# Patient Record
Sex: Male | Born: 1955 | Race: White | Hispanic: No | Marital: Married
Health system: Southern US, Community
[De-identification: ages and names within clinical notes are randomized; demographics above are authoritative.]

---

## 2004-07-20 ENCOUNTER — Encounter: Payer: Self-pay | Admitting: Psychiatry

## 2004-08-05 ENCOUNTER — Encounter: Payer: Self-pay | Admitting: Psychiatry

## 2004-09-05 ENCOUNTER — Encounter: Payer: Self-pay | Admitting: Psychiatry

## 2004-10-03 ENCOUNTER — Encounter: Payer: Self-pay | Admitting: Psychiatry

## 2004-11-03 ENCOUNTER — Encounter: Payer: Self-pay | Admitting: Psychiatry

## 2004-12-03 ENCOUNTER — Encounter: Payer: Self-pay | Admitting: Psychiatry

## 2005-01-03 ENCOUNTER — Encounter: Payer: Self-pay | Admitting: Psychiatry

## 2005-02-02 ENCOUNTER — Encounter: Payer: Self-pay | Admitting: Psychiatry

## 2005-07-16 ENCOUNTER — Encounter: Payer: Self-pay | Admitting: Family Medicine

## 2005-08-05 ENCOUNTER — Encounter: Payer: Self-pay | Admitting: Family Medicine

## 2007-02-16 ENCOUNTER — Ambulatory Visit: Payer: Self-pay | Admitting: Family Medicine

## 2007-04-13 ENCOUNTER — Other Ambulatory Visit: Payer: Self-pay

## 2007-04-13 ENCOUNTER — Inpatient Hospital Stay: Payer: Self-pay | Admitting: Otolaryngology

## 2010-10-22 ENCOUNTER — Ambulatory Visit: Payer: Self-pay | Admitting: Internal Medicine

## 2010-11-30 ENCOUNTER — Ambulatory Visit: Payer: Self-pay | Admitting: Internal Medicine

## 2010-12-10 ENCOUNTER — Ambulatory Visit: Payer: Self-pay | Admitting: Internal Medicine

## 2010-12-17 ENCOUNTER — Ambulatory Visit: Payer: Self-pay | Admitting: Family Medicine

## 2012-02-06 ENCOUNTER — Emergency Department: Payer: Self-pay | Admitting: *Deleted

## 2014-08-29 ENCOUNTER — Inpatient Hospital Stay (HOSPITAL_COMMUNITY): Payer: Medicare Other

## 2014-08-29 ENCOUNTER — Inpatient Hospital Stay: Payer: Self-pay | Admitting: Internal Medicine

## 2014-08-29 ENCOUNTER — Other Ambulatory Visit: Payer: Self-pay | Admitting: Physician Assistant

## 2014-08-29 DIAGNOSIS — I5021 Acute systolic (congestive) heart failure: Secondary | ICD-10-CM | POA: Diagnosis present

## 2014-08-29 DIAGNOSIS — Z515 Encounter for palliative care: Secondary | ICD-10-CM | POA: Diagnosis not present

## 2014-08-29 DIAGNOSIS — G931 Anoxic brain damage, not elsewhere classified: Secondary | ICD-10-CM | POA: Diagnosis not present

## 2014-08-29 DIAGNOSIS — Z8673 Personal history of transient ischemic attack (TIA), and cerebral infarction without residual deficits: Secondary | ICD-10-CM | POA: Diagnosis not present

## 2014-08-29 DIAGNOSIS — K567 Ileus, unspecified: Secondary | ICD-10-CM | POA: Diagnosis not present

## 2014-08-29 DIAGNOSIS — J95811 Postprocedural pneumothorax: Secondary | ICD-10-CM | POA: Diagnosis not present

## 2014-08-29 DIAGNOSIS — N183 Chronic kidney disease, stage 3 (moderate): Secondary | ICD-10-CM

## 2014-08-29 DIAGNOSIS — R570 Cardiogenic shock: Secondary | ICD-10-CM

## 2014-08-29 DIAGNOSIS — I129 Hypertensive chronic kidney disease with stage 1 through stage 4 chronic kidney disease, or unspecified chronic kidney disease: Secondary | ICD-10-CM | POA: Diagnosis not present

## 2014-08-29 DIAGNOSIS — I472 Ventricular tachycardia: Secondary | ICD-10-CM | POA: Diagnosis not present

## 2014-08-29 DIAGNOSIS — J9690 Respiratory failure, unspecified, unspecified whether with hypoxia or hypercapnia: Secondary | ICD-10-CM

## 2014-08-29 DIAGNOSIS — I4901 Ventricular fibrillation: Secondary | ICD-10-CM | POA: Diagnosis not present

## 2014-08-29 DIAGNOSIS — R739 Hyperglycemia, unspecified: Secondary | ICD-10-CM | POA: Diagnosis present

## 2014-08-29 DIAGNOSIS — I214 Non-ST elevation (NSTEMI) myocardial infarction: Secondary | ICD-10-CM | POA: Diagnosis present

## 2014-08-29 DIAGNOSIS — K7200 Acute and subacute hepatic failure without coma: Secondary | ICD-10-CM | POA: Diagnosis present

## 2014-08-29 DIAGNOSIS — I7092 Chronic total occlusion of artery of the extremities: Secondary | ICD-10-CM | POA: Diagnosis not present

## 2014-08-29 DIAGNOSIS — I2510 Atherosclerotic heart disease of native coronary artery without angina pectoris: Secondary | ICD-10-CM | POA: Diagnosis not present

## 2014-08-29 DIAGNOSIS — N179 Acute kidney failure, unspecified: Secondary | ICD-10-CM | POA: Diagnosis not present

## 2014-08-29 DIAGNOSIS — Z4659 Encounter for fitting and adjustment of other gastrointestinal appliance and device: Secondary | ICD-10-CM

## 2014-08-29 DIAGNOSIS — J449 Chronic obstructive pulmonary disease, unspecified: Secondary | ICD-10-CM | POA: Diagnosis not present

## 2014-08-29 DIAGNOSIS — J189 Pneumonia, unspecified organism: Secondary | ICD-10-CM | POA: Diagnosis not present

## 2014-08-29 DIAGNOSIS — I4510 Unspecified right bundle-branch block: Secondary | ICD-10-CM | POA: Diagnosis not present

## 2014-08-29 DIAGNOSIS — F17210 Nicotine dependence, cigarettes, uncomplicated: Secondary | ICD-10-CM | POA: Diagnosis present

## 2014-08-29 DIAGNOSIS — I5020 Unspecified systolic (congestive) heart failure: Secondary | ICD-10-CM | POA: Diagnosis not present

## 2014-08-29 DIAGNOSIS — J9601 Acute respiratory failure with hypoxia: Secondary | ICD-10-CM | POA: Diagnosis present

## 2014-08-29 DIAGNOSIS — E785 Hyperlipidemia, unspecified: Secondary | ICD-10-CM | POA: Diagnosis not present

## 2014-08-29 DIAGNOSIS — J984 Other disorders of lung: Secondary | ICD-10-CM

## 2014-08-29 DIAGNOSIS — Z66 Do not resuscitate: Secondary | ICD-10-CM | POA: Diagnosis not present

## 2014-08-29 DIAGNOSIS — N189 Chronic kidney disease, unspecified: Secondary | ICD-10-CM

## 2014-08-29 DIAGNOSIS — Z4682 Encounter for fitting and adjustment of non-vascular catheter: Secondary | ICD-10-CM

## 2014-08-29 DIAGNOSIS — I442 Atrioventricular block, complete: Secondary | ICD-10-CM | POA: Diagnosis present

## 2014-08-29 DIAGNOSIS — I70293 Other atherosclerosis of native arteries of extremities, bilateral legs: Secondary | ICD-10-CM | POA: Diagnosis present

## 2014-08-29 DIAGNOSIS — I469 Cardiac arrest, cause unspecified: Secondary | ICD-10-CM | POA: Diagnosis present

## 2014-08-29 DIAGNOSIS — E872 Acidosis: Secondary | ICD-10-CM | POA: Diagnosis not present

## 2014-08-29 DIAGNOSIS — J939 Pneumothorax, unspecified: Secondary | ICD-10-CM

## 2014-08-29 DIAGNOSIS — J9600 Acute respiratory failure, unspecified whether with hypoxia or hypercapnia: Secondary | ICD-10-CM | POA: Insufficient documentation

## 2014-08-29 DIAGNOSIS — E876 Hypokalemia: Secondary | ICD-10-CM | POA: Diagnosis not present

## 2014-08-29 DIAGNOSIS — I82443 Acute embolism and thrombosis of tibial vein, bilateral: Secondary | ICD-10-CM | POA: Diagnosis not present

## 2014-08-29 DIAGNOSIS — N170 Acute kidney failure with tubular necrosis: Secondary | ICD-10-CM | POA: Diagnosis not present

## 2014-08-29 DIAGNOSIS — J9801 Acute bronchospasm: Secondary | ICD-10-CM

## 2014-08-29 LAB — BLOOD GAS, ARTERIAL
Acid-base deficit: 11.2 mmol/L — ABNORMAL HIGH (ref 0.0–2.0)
Bicarbonate: 15.3 mEq/L — ABNORMAL LOW (ref 20.0–24.0)
Drawn by: 39898
FIO2: 0.4 %
MECHVT: 600 mL
O2 SAT: 95.3 %
PATIENT TEMPERATURE: 98.6
PCO2 ART: 40.7 mmHg (ref 35.0–45.0)
PEEP: 5 cmH2O
PH ART: 7.2 — AB (ref 7.350–7.450)
PO2 ART: 104 mmHg — AB (ref 80.0–100.0)
RATE: 16 resp/min
TCO2: 16.5 mmol/L (ref 0–100)

## 2014-08-29 LAB — URINALYSIS, COMPLETE
Bacteria: NONE SEEN
Bilirubin,UR: NEGATIVE
Bilirubin,UR: NEGATIVE
Glucose,UR: NEGATIVE mg/dL (ref 0–75)
Granular Cast: 28
Ketone: NEGATIVE
Leukocyte Esterase: NEGATIVE
NITRITE: NEGATIVE
NITRITE: NEGATIVE
PH: 5 (ref 4.5–8.0)
PH: 5 (ref 4.5–8.0)
Protein: 100
RBC,UR: 199 /HPF (ref 0–5)
RBC,UR: 28 /HPF (ref 0–5)
SPECIFIC GRAVITY: 1.015 (ref 1.003–1.030)
SPECIFIC GRAVITY: 1.033 (ref 1.003–1.030)
Squamous Epithelial: 3
Squamous Epithelial: 2
WBC UR: 63 /HPF (ref 0–5)
WBC UR: 32 /HPF (ref 0–5)

## 2014-08-29 LAB — MRSA PCR SCREENING: MRSA by PCR: NEGATIVE

## 2014-08-29 LAB — COMPREHENSIVE METABOLIC PANEL
ALT: 211 U/L — AB
Albumin: 2.5 g/dL — ABNORMAL LOW (ref 3.4–5.0)
Albumin: 2.6 g/dL — ABNORMAL LOW (ref 3.4–5.0)
Alkaline Phosphatase: 83 U/L
Alkaline Phosphatase: 142 U/L — ABNORMAL HIGH
Anion Gap: 24 — ABNORMAL HIGH (ref 7–16)
Anion Gap: 20 — ABNORMAL HIGH (ref 7–16)
BUN: 13 mg/dL (ref 7–18)
BUN: 16 mg/dL (ref 7–18)
Bilirubin,Total: <0.1 mg/dL — ABNORMAL LOW (ref 0.2–1.0)
Bilirubin,Total: 0.3 mg/dL (ref 0.2–1.0)
CHLORIDE: 109 mmol/L — AB (ref 98–107)
CHLORIDE: 107 mmol/L (ref 98–107)
CO2: 17 mmol/L — AB (ref 21–32)
CO2: 18 mmol/L — AB (ref 21–32)
CREATININE: 1.9 mg/dL — AB (ref 0.60–1.30)
Calcium, Total: 11.5 mg/dL — ABNORMAL HIGH (ref 8.5–10.1)
Calcium, Total: 8.7 mg/dL (ref 8.5–10.1)
Creatinine: 1.74 mg/dL — ABNORMAL HIGH (ref 0.60–1.30)
EGFR (Non-African Amer.): 43 — ABNORMAL LOW
EGFR (Non-African Amer.): 39 — ABNORMAL LOW
GFR CALC AF AMER: 52 — AB
GFR CALC AF AMER: 47 — AB
Glucose: 220 mg/dL — ABNORMAL HIGH (ref 65–99)
Glucose: 240 mg/dL — ABNORMAL HIGH (ref 65–99)
OSMOLALITY: 305 (ref 275–301)
Osmolality: 298 (ref 275–301)
POTASSIUM: 4.3 mmol/L (ref 3.5–5.1)
Potassium: 3 mmol/L — ABNORMAL LOW (ref 3.5–5.1)
SGOT(AST): 67 U/L — ABNORMAL HIGH (ref 15–37)
SGOT(AST): 732 U/L — ABNORMAL HIGH (ref 15–37)
SGPT (ALT): 56 U/L
SODIUM: 150 mmol/L — AB (ref 136–145)
Sodium: 145 mmol/L (ref 136–145)
Total Protein: 5.7 g/dL — ABNORMAL LOW (ref 6.4–8.2)
Total Protein: 6.4 g/dL (ref 6.4–8.2)

## 2014-08-29 LAB — BASIC METABOLIC PANEL
ANION GAP: 17 — AB (ref 5–15)
Anion gap: 17 — ABNORMAL HIGH (ref 5–15)
BUN: 16 mg/dL (ref 6–23)
BUN: 17 mg/dL (ref 6–23)
CHLORIDE: 103 mmol/L (ref 96–112)
CO2: 22 mmol/L (ref 19–32)
CO2: 23 mmol/L (ref 19–32)
Calcium: 8.4 mg/dL (ref 8.4–10.5)
Calcium: 8.4 mg/dL (ref 8.4–10.5)
Chloride: 105 mmol/L (ref 96–112)
Creatinine, Ser: 2.08 mg/dL — ABNORMAL HIGH (ref 0.50–1.35)
Creatinine, Ser: 2.2 mg/dL — ABNORMAL HIGH (ref 0.50–1.35)
GFR calc Af Amer: 38 mL/min — ABNORMAL LOW (ref >90)
GFR calc Af Amer: 36 mL/min — ABNORMAL LOW (ref >90)
GFR calc non Af Amer: 31 mL/min — ABNORMAL LOW (ref >90)
GFR, EST NON AFRICAN AMERICAN: 33 mL/min — AB (ref >90)
Glucose, Bld: 262 mg/dL — ABNORMAL HIGH (ref 70–99)
Glucose, Bld: 267 mg/dL — ABNORMAL HIGH (ref 70–99)
Potassium: 3.3 mmol/L — ABNORMAL LOW (ref 3.5–5.1)
Potassium: 3.7 mmol/L (ref 3.5–5.1)
Sodium: 144 mmol/L (ref 135–145)
Sodium: 143 mmol/L (ref 135–145)

## 2014-08-29 LAB — CBC WITH DIFFERENTIAL/PLATELET
BASOS ABS: 0.1 10*3/uL (ref 0.0–0.1)
BASOS PCT: 0.4 %
Bands: 2 %
Comment - H1-Com1: NORMAL
EOS ABS: 0.1 10*3/uL (ref 0.0–0.7)
Eosinophil %: 0.2 %
HCT: 45.9 % (ref 40.0–52.0)
HCT: 45.2 % (ref 40.0–52.0)
HGB: 14 g/dL (ref 13.0–18.0)
HGB: 14.5 g/dL (ref 13.0–18.0)
LYMPHS PCT: 52 %
Lymphocyte #: 2 10*3/uL (ref 1.0–3.6)
Lymphocyte %: 7.3 %
MCH: 30.5 pg (ref 26.0–34.0)
MCH: 30 pg (ref 26.0–34.0)
MCHC: 30.4 g/dL — ABNORMAL LOW (ref 32.0–36.0)
MCHC: 32.1 g/dL (ref 32.0–36.0)
MCV: 100 fL (ref 80–100)
MCV: 93 fL (ref 80–100)
MONO ABS: 0.8 x10 3/mm (ref 0.2–1.0)
Metamyelocyte: 4 %
Monocyte %: 3.1 %
Monocytes: 13 %
Myelocyte: 1 %
Neutrophil #: 24 10*3/uL — ABNORMAL HIGH (ref 1.4–6.5)
Neutrophil %: 89 %
PLATELETS: 155 10*3/uL (ref 150–440)
PLATELETS: 174 10*3/uL (ref 150–440)
RBC: 4.58 10*6/uL (ref 4.40–5.90)
RBC: 4.83 10*6/uL (ref 4.40–5.90)
RDW: 14.4 % (ref 11.5–14.5)
RDW: 14 % (ref 11.5–14.5)
SEGMENTED NEUTROPHILS: 28 %
WBC: 13.4 10*3/uL — AB (ref 3.8–10.6)
WBC: 27 10*3/uL — AB (ref 3.8–10.6)

## 2014-08-29 LAB — PROTIME-INR
INR: 1.4
INR: 1.8
INR: 1.86 — ABNORMAL HIGH (ref 0.00–1.49)
PROTHROMBIN TIME: 20.2 s — AB (ref 11.5–14.7)
Prothrombin Time: 17.2 secs — ABNORMAL HIGH (ref 11.5–14.7)
Prothrombin Time: 21.6 seconds — ABNORMAL HIGH (ref 11.6–15.2)

## 2014-08-29 LAB — TROPONIN I
Troponin I: 37.78 ng/mL (ref <0.031)
Troponin-I: 2.2 ng/mL — ABNORMAL HIGH
Troponin-I: >40 ng/mL

## 2014-08-29 LAB — HEPATIC FUNCTION PANEL A (ARMC)
Albumin: 2.7 g/dL — ABNORMAL LOW (ref 3.4–5.0)
Alkaline Phosphatase: 122 U/L — ABNORMAL HIGH
BILIRUBIN TOTAL: 0.3 mg/dL (ref 0.2–1.0)
SGOT(AST): 478 U/L — ABNORMAL HIGH (ref 15–37)
SGPT (ALT): 210 U/L — ABNORMAL HIGH
Total Protein: 6.2 g/dL — ABNORMAL LOW (ref 6.4–8.2)

## 2014-08-29 LAB — DIC (DISSEMINATED INTRAVASCULAR COAGULATION) PANEL
Fibrinogen: 162 mg/dL — ABNORMAL LOW (ref 204–475)
PROTHROMBIN TIME: 21.8 s — AB (ref 11.6–15.2)

## 2014-08-29 LAB — CK: CK, Total: 6031 U/L — ABNORMAL HIGH (ref 39–308)

## 2014-08-29 LAB — DIC (DISSEMINATED INTRAVASCULAR COAGULATION)PANEL
INR: 1.88 — ABNORMAL HIGH (ref 0.00–1.49)
Platelets: 140 10*3/uL — ABNORMAL LOW (ref 150–400)
Smear Review: NONE SEEN
aPTT: 46 seconds — ABNORMAL HIGH (ref 24–37)

## 2014-08-29 LAB — PHOSPHORUS
PHOSPHORUS: 8.6 mg/dL — AB (ref 2.5–4.9)
Phosphorus: 7.4 mg/dL — ABNORMAL HIGH (ref 2.5–4.9)

## 2014-08-29 LAB — CK-MB: CK-MB: 287.5 ng/mL — ABNORMAL HIGH (ref 0.5–3.6)

## 2014-08-29 LAB — APTT
APTT: 36 s (ref 24–37)
Activated PTT: 39.4 secs — ABNORMAL HIGH (ref 23.6–35.9)

## 2014-08-29 LAB — PRO B NATRIURETIC PEPTIDE: B-TYPE NATIURETIC PEPTID: 6791 pg/mL — AB (ref 0–125)

## 2014-08-29 LAB — MAGNESIUM
MAGNESIUM: 2.7 mg/dL — AB
Magnesium: 2.4 mg/dL

## 2014-08-29 LAB — CK TOTAL AND CKMB (NOT AT ARMC)
CK, Total: 404 U/L — ABNORMAL HIGH (ref 39–308)
CK-MB: 16.1 ng/mL — AB (ref 0.5–3.6)

## 2014-08-29 MED ORDER — ASPIRIN 300 MG RE SUPP
300.0000 mg | Freq: Every day | RECTAL | Status: DC
  Administered 2014-08-30: 300 mg via RECTAL
  Filled 2014-08-29 (×2): qty 1

## 2014-08-29 MED ORDER — CISATRACURIUM BOLUS VIA INFUSION: 0.0500 mg/kg | INTRAVENOUS | Status: DC | PRN

## 2014-08-29 MED ORDER — MIDAZOLAM HCL 2 MG/2ML IJ SOLN
2.0000 mg | Freq: Once | INTRAMUSCULAR | Status: DC
Start: 2014-08-29 — End: 2014-08-30

## 2014-08-29 MED ORDER — SODIUM BICARBONATE 8.4 % IV SOLN
INTRAVENOUS | Status: DC
  Administered 2014-08-29 – 2014-08-30 (×2): via INTRAVENOUS
  Filled 2014-08-29 (×3): qty 850

## 2014-08-29 MED ORDER — POTASSIUM CHLORIDE 10 MEQ/50ML IV SOLN
10.0000 meq | INTRAVENOUS | Status: AC
Start: 2014-08-29 — End: 2014-08-30
  Administered 2014-08-30 (×4): 10 meq via INTRAVENOUS

## 2014-08-29 MED ORDER — FENTANYL BOLUS VIA INFUSION
50.0000 ug | INTRAVENOUS | Status: DC | PRN
  Administered 2014-08-31: 50 ug via INTRAVENOUS
  Filled 2014-08-29: qty 50

## 2014-08-29 MED ORDER — CISATRACURIUM BOLUS VIA INFUSION
0.1000 mg/kg | Freq: Once | INTRAVENOUS | Status: AC
  Administered 2014-08-29: 9.1 mg via INTRAVENOUS
  Filled 2014-08-29: qty 10

## 2014-08-29 MED ORDER — NOREPINEPHRINE BITARTRATE 1 MG/ML IV SOLN
0.0000 ug/min | INTRAVENOUS | Status: DC
  Administered 2014-08-29: 15 ug/min via INTRAVENOUS
  Administered 2014-08-29: 25 ug/min via INTRAVENOUS
  Administered 2014-08-30: 45 ug/min via INTRAVENOUS
  Filled 2014-08-29 (×3): qty 16

## 2014-08-29 MED ORDER — SODIUM CHLORIDE 0.9 % IV SOLN
0.5000 ug/kg/min | INTRAVENOUS | Status: AC
  Administered 2014-08-29: 0.5 ug/kg/min via INTRAVENOUS
  Filled 2014-08-29: qty 20

## 2014-08-29 MED ORDER — SODIUM CHLORIDE 0.9 % IV SOLN: 2000.0000 mL | Freq: Once | INTRAVENOUS | Status: DC

## 2014-08-29 MED ORDER — SODIUM CHLORIDE 0.9 % IV SOLN
1.0000 ug/kg/min | INTRAVENOUS | Status: DC
  Administered 2014-08-29: 1 ug/kg/min via INTRAVENOUS

## 2014-08-29 MED ORDER — SODIUM CHLORIDE 0.9 % IV SOLN
1.0000 mg/h | INTRAVENOUS | Status: DC
  Filled 2014-08-29: qty 10

## 2014-08-29 MED ORDER — SODIUM CHLORIDE 0.9 % IV SOLN
INTRAVENOUS | Status: DC
  Administered 2014-08-29: 19:00:00 via INTRAVENOUS

## 2014-08-29 MED ORDER — FENTANYL CITRATE 0.05 MG/ML IJ SOLN
100.0000 ug | Freq: Once | INTRAMUSCULAR | Status: AC
  Administered 2014-08-29: 100 ug via INTRAVENOUS

## 2014-08-29 MED ORDER — MIDAZOLAM HCL 5 MG/ML IJ SOLN
1.0000 mg/h | INTRAMUSCULAR | Status: DC
  Administered 2014-08-29: 1 mg/h via INTRAVENOUS
  Administered 2014-08-30: 2 mg/h via INTRAVENOUS
  Filled 2014-08-29 (×3): qty 10

## 2014-08-29 MED ORDER — MIDAZOLAM BOLUS VIA INFUSION
2.0000 mg | INTRAVENOUS | Status: DC | PRN
  Filled 2014-08-29: qty 2

## 2014-08-29 MED ORDER — BUDESONIDE 0.25 MG/2ML IN SUSP
0.2500 mg | Freq: Four times a day (QID) | RESPIRATORY_TRACT | Status: DC
  Filled 2014-08-29 (×3): qty 2

## 2014-08-29 MED ORDER — DOPAMINE-DEXTROSE 3.2-5 MG/ML-% IV SOLN
5.0000 ug/kg/min | INTRAVENOUS | Status: DC
  Administered 2014-08-29: 5 ug/kg/min via INTRAVENOUS

## 2014-08-29 MED ORDER — IPRATROPIUM-ALBUTEROL 0.5-2.5 (3) MG/3ML IN SOLN
3.0000 mL | Freq: Four times a day (QID) | RESPIRATORY_TRACT | Status: DC
  Administered 2014-08-29 – 2014-08-30 (×5): 3 mL via RESPIRATORY_TRACT
  Filled 2014-08-29 (×5): qty 3

## 2014-08-29 MED ORDER — DEXTROSE 5 % IV SOLN
0.5000 ug/min | INTRAVENOUS | Status: DC
  Filled 2014-08-29: qty 4

## 2014-08-29 MED ORDER — CISATRACURIUM BOLUS VIA INFUSION
0.0500 mg/kg | INTRAVENOUS | Status: DC | PRN
Start: 2014-08-29 — End: 2014-08-31
  Filled 2014-08-29: qty 5

## 2014-08-29 MED ORDER — NOREPINEPHRINE BITARTRATE 1 MG/ML IV SOLN
0.0000 ug/min | INTRAVENOUS | Status: DC
  Filled 2014-08-29: qty 16

## 2014-08-29 MED ORDER — INSULIN ASPART 100 UNIT/ML ~~LOC~~ SOLN
0.0000 [IU] | SUBCUTANEOUS | Status: DC
  Administered 2014-08-29: 3 [IU] via SUBCUTANEOUS
  Administered 2014-08-30: 2 [IU] via SUBCUTANEOUS

## 2014-08-29 MED ORDER — HEPARIN (PORCINE) IN NACL 100-0.45 UNIT/ML-% IJ SOLN
1200.0000 [IU]/h | INTRAMUSCULAR | Status: DC
  Administered 2014-08-29 – 2014-08-30 (×2): 1200 [IU]/h via INTRAVENOUS
  Filled 2014-08-29 (×3): qty 250

## 2014-08-29 MED ORDER — AMIODARONE HCL IN DEXTROSE 360-4.14 MG/200ML-% IV SOLN
30.0000 mg/h | INTRAVENOUS | Status: DC
  Administered 2014-08-30 (×2): 30 mg/h via INTRAVENOUS
  Filled 2014-08-29 (×5): qty 200

## 2014-08-29 MED ORDER — AMIODARONE HCL IN DEXTROSE 360-4.14 MG/200ML-% IV SOLN
60.0000 mg/h | INTRAVENOUS | Status: AC
  Administered 2014-08-29 (×2): 60 mg/h via INTRAVENOUS
  Filled 2014-08-29 (×2): qty 200

## 2014-08-29 MED ORDER — CISATRACURIUM BOLUS VIA INFUSION: 0.1000 mg/kg | Freq: Once | INTRAVENOUS | Status: DC

## 2014-08-29 MED ORDER — SODIUM CHLORIDE 0.9 % IV SOLN
25.0000 ug/h | INTRAVENOUS | Status: DC
  Administered 2014-08-30: 100 ug/h via INTRAVENOUS
  Filled 2014-08-29 (×2): qty 50

## 2014-08-29 MED ORDER — POTASSIUM CHLORIDE 10 MEQ/50ML IV SOLN
INTRAVENOUS | Status: AC
  Filled 2014-08-29: qty 200

## 2014-08-29 MED ORDER — VASOPRESSIN 20 UNIT/ML IV SOLN
0.0300 [IU]/min | INTRAVENOUS | Status: DC
  Administered 2014-08-29 – 2014-08-30 (×2): 0.03 [IU]/min via INTRAVENOUS
  Filled 2014-08-29 (×3): qty 2

## 2014-08-29 MED ORDER — ARTIFICIAL TEARS OP OINT
1.0000 "application " | TOPICAL_OINTMENT | Freq: Three times a day (TID) | OPHTHALMIC | Status: DC
  Administered 2014-08-30 (×4): 1 via OPHTHALMIC
  Filled 2014-08-29: qty 3.5

## 2014-08-29 MED ORDER — CISATRACURIUM BESYLATE 10 MG/ML IV SOLN
1.0000 ug/kg/min | INTRAVENOUS | Status: DC
  Administered 2014-08-29 – 2014-08-30 (×2): 1 ug/kg/min via INTRAVENOUS
  Filled 2014-08-29 (×2): qty 20

## 2014-08-29 MED ORDER — DOPAMINE-DEXTROSE 3.2-5 MG/ML-% IV SOLN
INTRAVENOUS | Status: AC
  Filled 2014-08-29: qty 250

## 2014-08-29 MED ORDER — BUDESONIDE 0.25 MG/2ML IN SUSP
0.2500 mg | Freq: Four times a day (QID) | RESPIRATORY_TRACT | Status: DC
  Administered 2014-08-29 – 2014-08-30 (×5): 0.25 mg via RESPIRATORY_TRACT
  Filled 2014-08-29 (×11): qty 2

## 2014-08-29 MED ORDER — FAMOTIDINE IN NACL 20-0.9 MG/50ML-% IV SOLN
20.0000 mg | Freq: Two times a day (BID) | INTRAVENOUS | Status: DC
  Administered 2014-08-29 – 2014-08-30 (×3): 20 mg via INTRAVENOUS
  Filled 2014-08-29 (×4): qty 50

## 2014-08-29 NOTE — H&P (Deleted)
PULMONARY / CRITICAL CARE MEDICINE   Name: Dustin Chavez MRN: [id number] DOB: February 03, 1956    ADMISSION DATE:  Oct 09, 2014 CONSULTATION DATE:  Oct 09, 2014  [facility]  [facility]  CHIEF COMPLAINT:  Cardiac arrest  INITIAL PRESENTATION: 59 year old male presented to [facility] c/o SOB. VT/VF arrest in ED. Cardiac cath found 3 vessel disease and performed no intervention. VT/VF arrest again in cath lab. Exact total ACLS time unknown but estimated at less than 25 mins. Hypothermia protocol initiated and [facility].   STUDIES:   SIGNIFICANT EVENTS: 1/25 VT/VF arrest x2, intubated. Admitted to ICU. 3 vessel disease by cath, LVEF 40% per LVgram.   HISTORY OF PRESENT ILLNESS:  59 year old male, 3 ppd smoker with PMH of HTN and HLD began feeling SOB 1/24 PM. 1/25 AM he contacted EMS and [facility]. He was intubated for respiratory distress and then suffered a VT/VF arrest. After ROSC was achieved he was taken to cath lab, which discovered 3 vessel disease, including complete occlusion of LCX and RCA. Recommended surgical intervention. He suffered another arrest in cath lab, which lasted about 10 minutes. Also CXR showed L PTX, presumed to be the result of CPR. Therapeutic hypothermia protocol initiated and [facility] PCCM to admit.   PAST MEDICAL HISTORY :   has no past medical history on file.  has no past surgical history on file. Prior to Admission medications   Not on File   Allergies not on file  FAMILY HISTORY:  has no family status information on file.  SOCIAL HISTORY:    REVIEW OF SYSTEMS:  Unable, intubated  SUBJECTIVE:   VITAL SIGNS: FiO2 (%):  [40 %] 40 % (01/25 1814) Weight:  [91.3 kg (201 lb 4.5 oz)] 91.3 kg (201 lb 4.5 oz) (01/25 1814) HEMODYNAMICS:   VENTILATOR SETTINGS: Vent Mode:  [-] PRVC FiO2 (%):  [40 %] 40 % Set Rate:  [16 bmp] 16 bmp Vt Set:  [600 mL] 600 mL PEEP:  [5 cmH20] 5 cmH20 Plateau Pressure:  [19  cmH20] 19 cmH20 INTAKE / OUTPUT: No intake or output data in the 24 hours ending 04-15-2015 1848  PHYSICAL EXAMINATION: General:  Overweight male sedated on vent Neuro:  sedated HEENT:  [place]/AT, no JVD noted Cardiovascular:  Paced rhythm at 100bpm. No MRG noted Lungs:  Clear bilateral breath sounds, L chest tube in place, with mild bloody drainage. Abdomen:  Obese, soft, non-distended Musculoskeletal:  No acute deformity Skin:  Grossly intact  LABS:  CBC No results for input(s): WBC, HGB, HCT, PLT in the last 168 hours. Coag's No results for input(s): APTT, INR in the last 168 hours. BMET No results for input(s): NA, K, CL, CO2, BUN, CREATININE, GLUCOSE in the last 168 hours. Electrolytes No results for input(s): CALCIUM, MG, PHOS in the last 168 hours. Sepsis Markers No results for input(s): LATICACIDVEN, PROCALCITON, O2SATVEN in the last 168 hours. ABG No results for input(s): PHART, PCO2ART, PO2ART in the last 168 hours. Liver Enzymes No results for input(s): AST, ALT, ALKPHOS, BILITOT, ALBUMIN in the last 168 hours. Cardiac Enzymes No results for input(s): TROPONINI, PROBNP in the last 168 hours. Glucose No results for input(s): GLUCAP in the last 168 hours.  Imaging No results found.   ASSESSMENT / PLAN:  PULMONARY OETT 1/25 >> A: Acute respiratory failure 2nd to cardiac arrest L PTX, suspected 2nd to CPR  P:   Full vent support Follow ABG  CXR in AM VAP prevention/mouth care per protocol Left CT to 20 cm/H2O suction  CARDIOVASCULAR CVL RIJ 1/25 >>> L fem art line 1/25 >>> A:  VT/VF cardiac arrest Cardiogenic shock CAD Complete heart block  P:  Cardiology aware Amiodarone gtt MAP goal > 80 mm/Hg Levophed, titrate to achieve MAP goal Vasopressin Transvenous pacing per cards Gentle IVF resuscitation Heparin gtt per pharmacy Therapeutic hypothermia 33 degrees Cycle troponin  RENAL A:   AKI  P:   Follow Bmet  GASTROINTESTINAL A:   No  acute issues  P:   OGT in place NPO for now SUP: IV protonix  HEMATOLOGIC A:   No acute issues  P:  Follow CBC On heparin gtt  INFECTIOUS A:   No acute issues  P:   Monitor clinically  ENDOCRINE A:   No acute issues    P:   CBG monitoring with SSI  NEUROLOGIC A:   Acute metabolic encephalopathy   P:   RASS goal: -5 Midazolam, fentanyl gtt Nimbex   FAMILY  - Updates: No family at bedside  - Inter-disciplinary family meet or Palliative Care meeting due by:  2/1   Joneen Roach, AGACNP-BC [facility] Pager [phone] or [phone]   ,

## 2014-08-29 NOTE — Progress Notes (Signed)
  Patient Name: Warden Fillersdward M Lythgoe   MRN: [id number]   Date of Birth/ Sex: 10-15-1955 , male      Admission Date: 05-Aug-2015  Attending Provider: Nelda Bucksaniel J Feinstein, MD  Primary Diagnosis: <principal problem not specified>   Indication:  Patient with severe 3 vessel disease, s/p VT/VF cardiac arrest 09-07-2014 [facility]. He had 15 mins CPR at cath lab as well before LHC. Before our arrival, he was having issues with pacer. Dr. Tresa EndoKelly cardiology was present in the room and and Dr. Delton CoombesByrum PCCM E-link was with us during the code. Patient did not lose pulse, had pulse confirmed manually and also via doppler on left femoral artery. Arterial line was placed for better measurement of blood pressure. He was started on levo, dopa, and vaso for pressure support. He had 3 episodes of VF requiring defib. Was put on amio drip as well. Pt was in his usual state of health until 08/28/2014. At the time of arrival on scene, ACLS protocol was underway.   Technical Description:  - CPR performance duration:  No cpr.   - Was defibrillation or cardioversion used? Yes   - Was external pacer placed? Yes  - Was patient intubated pre/post CPR? Yes   Medications Administered: Y = Yes; Blank = No Amiodarone  y  Atropine    Calcium  y  Epinephrine  y  Lidocaine    Magnesium  y  Norepinephrine  y  Phenylephrine    Sodium bicarbonate  y  Vasopressin  y   Post CPR evaluation:  - Final Status - Was patient successfully resuscitated ? Yes - What is current rhythm? NSR - What is current hemodynamic status? On pressors (levo, dopa, and vaso).  Miscellaneous Information:  - Labs sent, including: CBC, CMP, lactate, CXR.   - Primary team notified?  Yes  - Family Notified? Yes  - Additional notes/ transfer status:   Pacer back up rhythm was set to 60. Pressors were titrated and left on vaso, dopa, levo. Also left on amio drip. RN was told by Dr. Tresa EndoKelly to give lidocaine if he gets Vtach  again. PCCM and cardiology will continue to follow. CT surgery is aware of the patient and his guarded prognosis. Continue heparin for anticoag. Cont hypothermia protocol.     Hyacinth Meekerasrif Marlis Oldaker, MD  05-Aug-2015, 11:19 PM

## 2014-08-29 NOTE — Progress Notes (Signed)
ANTICOAGULATION CONSULT NOTE - Initial Consult  Pharmacy Consult for Heparin Indication: chest pain/ACS  Allergies not on file  Patient Measurements: Height: 5\' 9"  (175.3 cm) Weight: 201 lb 4.5 oz (91.3 kg) IBW/kg (Calculated) : 70.7 Heparin Dosing Weight: 88 kg  Vital Signs:    Labs: No results for input(s): HGB, HCT, PLT, APTT, LABPROT, INR, HEPARINUNFRC, CREATININE, CKTOTAL, CKMB, TROPONINI in the last 72 hours.  CrCl cannot be calculated (Unknown ideal weight.).   Medical History: No past medical history on file.  Medications:  No prescriptions prior to admission   Scheduled:  . sodium chloride  2,000 mL Intravenous Once  . artificial tears  1 application Both Eyes 3 times per day  . [START ON 08/30/2014] aspirin  300 mg Rectal Daily  . cisatracurium (NIMBEX) infusion  0.5-10 mcg/kg/min (Order-Specific) Intravenous STAT  . cisatracurium  0.1 mg/kg Intravenous Once  . famotidine (PEPCID) IV  20 mg Intravenous Q12H  . insulin aspart  0-9 Units Subcutaneous 6 times per day  . ipratropium-albuterol  3 mL Nebulization Q6H  . midazolam  2 mg Intravenous Once   Infusions:  . amiodarone 60 mg/hr (2014/08/20 1830)   Followed by  . [START ON 08/30/2014] amiodarone    . cisatracurium (NIMBEX) infusion    . fentaNYL infusion INTRAVENOUS 100 mcg/hr (2014/08/20 1830)  . midazolam (VERSED) infusion    . norepinephrine (LEVOPHED) Adult infusion 25 mcg/min (2014/08/20 1844)    Assessment: 58yo male with h/o HTN and HLD presented [facility] with SOB on 1/24. Pt suffered VT/VF arrest in ED and went to cath finding 3 vessel disease. He was initiated on hypothermia protocol and [facility]. Pharmacy is consulted to dose heparin for ACS/STEMI. Hgb is pending, Plt 140, sCr 2.2.  Goal of Therapy:  Heparin level 0.3-0.7 units/ml Monitor platelets by anticoagulation protocol: Yes   Plan:  Start heparin infusion at 1200 units/hr Check anti-Xa level in 6 hours and daily while on  heparin Continue to monitor H&H and platelets  Monitor s/sx of bleeding  Arlean Hoppingorey M. Newman PiesBall, PharmD Clinical Pharmacist Pager [phone] 08/07/2014,6:34 PM

## 2014-08-29 NOTE — Progress Notes (Signed)
Pt transvenous pacer failed to capture, pt HR in the 50s. RT in room attempting A-line placement noted sudden loss of dopplered pulse in rt radial artery. Faint pulse was noted by this RN in left corotid artery. Cardiology fellow and CCM at bedside. A-line placed by CCM. Pt shocked out of VT twice. ACLS meds given. See code sheet for details. Last vitals: HR:77, BP: 156/77, SpO2: 97.

## 2014-08-29 NOTE — Consult Note (Signed)
Admit date: 09/02/2014 Referring Physician  Dr. Sung Amabile Primary Physician No PCP Per Patient Primary Cardiologist  Dr. Welton Flakes [facility]) Reason for Consultation  Cardiac arrest, 3v VAD  HPI: 59 year old male who survived cardiac arrest on 08/21/2014 prior to cardiac catheterization at [facility] which demonstrated severe three-vessel coronary artery disease transported to [facility] for hypothermia protocol and possible coronary artery bypass grafting.  Risk factors include 3 pack per day smoker, hypertension, hyperlipidemia, ? stroke.  He began feeling shortness of breath day prior to cardiac arrest on 08/28/14 then early in the morning of 08/30/2014 he contacted EMS and was transported to [facility] suffering acute respiratory failure. He then had witnessed VT/VF cardiac arrest. After approximate 15 minutes, return of spontaneous circulation was achieved and he was then taken to the cardiac catheterization lab where diagnostic coronary angiography was performed demonstrating occlusion of left circumflex as well as right coronary artery. 80% mid LAD lesion. During procedure, he coded once again, approximate 10 minutes of resuscitation efforts ensued. Temporary pacer wire was placed.  After intubation, chest x-ray demonstrated left pneumothorax. Chest tube was inserted. Hypothermia protocol began.  Apparently, ejection fraction was 40% from LV gram.   When discussing with on call MD from earlier in the day, Dr. Laneta Simmers of cardiothoracic surgery is apparently aware of the case.     PMH:  As above  PSH:  No past surgical history on file. Allergies:  Review of patient's allergies indicates no known allergies. Prior to Admit Meds:   Prior to Admission medications   Not on File   Fam HX:   Currently noncontributory, possibly no early family history of coronary artery disease.  Social HX:    History   Social History  . Marital Status: Married    Spouse Name:  N/A    Number of Children: N/A  . Years of Education: N/A   Occupational History  . Not on file.   Social History Main Topics  . Smoking status: Not on file  . Smokeless tobacco: Not on file  . Alcohol Use: Not on file  . Drug Use: Not on file  . Sexual Activity: Not on file   Other Topics Concern  . Not on file   Social History Narrative  . No narrative on file     ROS: Unable to conduct review of systems secondary to patient's current state.   Physical Exam: Blood pressure 122/98, pulse 99, resp. rate 19, height  (1.753 m), weight 201 lb 4.5 oz (91.3 kg), SpO2 96 %.   General: Sedated, on ventilator, hypothermia protocol Head: Pupils seem to react very sluggishly, possible petechiae on forehead   Normal cephalic and atramatic  Lungs:   Vent noise. Expiratory wheeze Heart:   HRRR S1 S2 Pulses are 2+ & equal. No murmur, rubs, gallops.  No carotid bruit. No JVD.  No abdominal bruits.  Abdomen: Decreased bowel sounds, abdomen soft and non-tender without masses. No hepatosplenomegaly. Msk: Unable to assess Extremities:  No clubbing, cyanosis or edema.  DP +1 Neuro: Obtunded GU: Deferred Rectal: Deferred Psych:  Sedate      Labs:  Serum creatinine 1.74 at [facility].   Radiology:  Left pneumothorax seen on chest film at [facility]. Personally viewed.  EKG:  Currently sinus rhythm, no ST segment changes on telemetry. Currently not paced. Personally viewed.   ASSESSMENT/PLAN:    59 year old male with cardiac arrest, VT/VF on 08/07/2014 with ejection fraction of 40%, severe  triple-vessel coronary artery disease with occluded left circumflex, right coronary artery as well as 80% mid LAD lesion, intubated with acute respiratory failure, left pneumothorax status post chest tube, temporary pacer wire post cardiac arrest currently on hypothermia protocol.  Cardiac arrest -Hypothermia protocol per critical care medicine -With his severe coronary artery  disease, we will have to evaluate for candidacy for bypass surgery. Contact cardiothoracic surgery once neurologic status post arrest understood. Dr. Laneta SimmersBartle may be aware of the case. Make sure that disc of cardiac catheterization images is easily available.  Coronary artery disease -Obtain echocardiogram -40% ejection fraction on catheterization -When able, statin (currently shock liver precludes use), beta blocker, ACE inhibitor  Post arrest bradycardia -Temporary pacer in place, may be positionally sensitive. Currently sinus rhythm without pacing. Monitor closely. -Heparin IV -Continue will reassessment of rhythm for timing of removal of pacemaker wire.  Chronic kidney disease -Creatinine 1.7 on arrival to emergency room. -Watch for acute kidney injury  We will closely follow.    Dustin SchultzSKAINS, Dustin Mangal, MD  08/10/2014  7:00 PM

## 2014-08-29 NOTE — Procedures (Signed)
Arterial Catheter Insertion Procedure Note Dustin Chavez [id number] [birth year]  Procedure: Insertion of Arterial Catheter  Indications: Blood pressure monitoring and Frequent blood sampling  Procedure Details Consent: Unable to obtain consent because of emergent medical necessity. Time Out: Verified patient identification, verified procedure, site/side was marked, verified correct patient position, special equipment/implants available, medications/allergies/relevent history reviewed, required imaging and test results available.  Performed  Maximum sterile technique was used including antiseptics, cap, gloves, gown, hand hygiene, mask and sheet. Skin prep: Chlorhexidine; local anesthetic administered 20 gauge catheter was inserted into left femoral artery using the Seldinger technique.  Evaluation Blood flow good; BP tracing good. Complications: No apparent complications.   Dustin Chavez, [place]PA - [facility] Pgr: [phone]  or [phone] March 09, 2015, 11:17 PM  Dustin Fischeravid Donavyn Fecher, MD ; [facility] service Mobile [phone].  After 5:30 PM or weekends, call [phone]

## 2014-08-29 NOTE — Progress Notes (Signed)
Amiodarone Drug - Drug Interaction Consult Note  Recommendations: Pt is on escitalopram. When given in addition to amiodarone, it could prolong QTc and increase risk of Torsades de pointes. Recommend monitoring QTc, Mg, and K.  Amiodarone is metabolized by the cytochrome P450 system and therefore has the potential to cause many drug interactions. Amiodarone has an average plasma half-life of 50 days (range 20 to 100 days).   There is potential for drug interactions to occur several weeks or months after stopping treatment and the onset of drug interactions may be slow after initiating amiodarone.   []  Statins: Increased risk of myopathy. Simvastatin- restrict dose to 20mg  daily. Other statins: counsel patients to report any muscle pain or weakness immediately.  []  Anticoagulants: Amiodarone can increase anticoagulant effect. Consider warfarin dose reduction. Patients should be monitored closely and the dose of anticoagulant altered accordingly, remembering that amiodarone levels take several weeks to stabilize.  []  Antiepileptics: Amiodarone can increase plasma concentration of phenytoin, the dose should be reduced. Note that small changes in phenytoin dose can result in large changes in levels. Monitor patient and counsel on signs of toxicity.  []  Beta blockers: increased risk of bradycardia, AV block and myocardial depression. Sotalol - avoid concomitant use.  []   Calcium channel blockers (diltiazem and verapamil): increased risk of bradycardia, AV block and myocardial depression.  []   Cyclosporine: Amiodarone increases levels of cyclosporine. Reduced dose of cyclosporine is recommended.  []  Digoxin dose should be halved when amiodarone is started.  []  Diuretics: increased risk of cardiotoxicity if hypokalemia occurs.  []  Oral hypoglycemic agents (glyburide, glipizide, glimepiride): increased risk of hypoglycemia. Patient's glucose levels should be monitored closely when initiating  amiodarone therapy.   [x]  Drugs that prolong the QT interval:  Torsades de pointes risk may be increased with concurrent use - avoid if possible.  Monitor QTc, also keep magnesium/potassium WNL if concurrent therapy can't be avoided. Marland Kitchen. Antibiotics: e.g. fluoroquinolones, erythromycin. . Antiarrhythmics: e.g. quinidine, procainamide, disopyramide, sotalol. . Antipsychotics: e.g. phenothiazines, haloperidol.  . Lithium, tricyclic antidepressants, and methadone.  Thank You,  Arlean Hoppingorey M. Newman PiesBall, PharmD Clinical Pharmacist Pager [phone]  08/18/2014 6:36 PM

## 2014-08-29 NOTE — Progress Notes (Addendum)
Brief X-Cover Cardiology Note  [name] is a 59 yo man who had a cardiac arrest 08/21/2014 and subsequent [facility] today revealing severe 3v CAD [facility] and consideration of CABG. While in the CCU, I was paged about pacing issues from his right femoral transvenous pacer. While evaluatin the patient and reviewing his chart, the RTs were attempting to place a radial arterial line with difficulty. Critical Care was also available in the box and at the bedside. He required uptitration of vasopressors and addition of dopamine to levophed and vasopressin. I assisted critical care with a left femoral arterial line, which was successfully placed. After placement of his arterial line, he had 3 episodes of VF requiring defibrillation. During my bedside time, he received several amps of bicarb, 2 amps of calcium, 2g of magnesium and CMP/CBC/lactate were redrawn. I also gave a 150 mg bolus of amiodarone and continued the drip. The tranvenous pacemaker appears to be intermittently capturing but underlying HR 60s-120s. CT surgery is aware of the patient but he is critically ill with guarded prognosis. Critical care is currently updating the family to current events. We will continue supportive care overnight with hypothermia protocol, hemodynamic support, anti-arrhythmic therapy with amiodarone, heparin for anticoagulation and low threshold to add on lidocaine bolus or gtt. I spent 40 minutes of direct bedside critical care in the care of [name].   Leeann MustJacob Valdez Brannan, MD

## 2014-08-29 NOTE — H&P (Addendum)
PULMONARY / CRITICAL CARE MEDICINE   Name: Dustin Chavez MRN: [id number] DOB: 1956/04/02    ADMISSION DATE:  September 19, 2014  REFERRING MD :  Charleston Ropes [facility])  INITIAL PRESENTATION:  43 M with multiple coronary RFs was admitted in transfer from [facility] where he suffered multiple VT/VF arrests and where he underwent cardiac catheterization which demonstrated severe 3VD. He was intubated and hypothermia started at [facility]. There is documentation of 15 mins of CPR in the cath lab prior to performance of LHC  SIGNIFICANT EVENTS/STUDIES:  1/25 Presented to [facility] ED with dyspnea X 24 hrs. Suffered cardiac arrest in ED. Intubated and hypothermia protocol initiated. CHB after ACLS. TPM placed. Cardiology consulted and taken to cath lab urgently - findings as noted below. L pneumothorax suffered during CPR. L chest tube placed 1/25 LHC ([place]): 1005 occluded RCA, LCx. 80 % mid LAD. LVEF 40% 1/25 Transferred to [facility] CCU for further eval and mgmt. Hypothermia protocol continued 1/25 Cards Consult:  1/25 TTE:  1/26 EEG:   INDWELLING DEVICES:: ETT 1/25 >>  R IJ CVL 1/25 >>  L chest tube 1/25 >>  Femoral arterial cath 1/25 >>  R femoral TPM 1/25 >>   MICRO DATA: None   ANTIMICROBIALS:  None    HISTORY OF PRESENT ILLNESS:   As above under initial presentation and sig events sections  PAST MEDICAL HISTORY :  Hypertension Hyperlipidemia CVA     FAMILY HISTORY: N/C  SOCIAL HISTORY: 3 ppd smoker  REVIEW OF SYSTEMS:  N/A  SUBJECTIVE:   VITAL SIGNS: FiO2 (%):  [40 %] 40 % (01/25 1814) Weight:  [91.3 kg (201 lb 4.5 oz)] 91.3 kg (201 lb 4.5 oz) (01/25 1814) HEMODYNAMICS:   VENTILATOR SETTINGS: Vent Mode:  [-] PRVC FiO2 (%):  [40 %] 40 % Set Rate:  [16 bmp] 16 bmp Vt Set:  [600 mL] 600 mL PEEP:  [5 cmH20] 5 cmH20 Plateau Pressure:  [19 cmH20] 19 cmH20 INTAKE / OUTPUT: No intake or output data in the 24 hours ending 19-Sep-2014  1846  PHYSICAL EXAMINATION: General: sedated, intubated, unresponsive Neuro:  No spont movement, pupils equal and react HEENT: NCAT, WNL Cardiovascular: paced rhythm, distant HS, no M noted Lungs:  Diffuse prolonged exp wheezes Abdomen:  Soft, diminished BS Ext: cool, markedly diminished pulses, delayed cap refill, no edema  LABS:  CBC No results for input(s): WBC, HGB, HCT, PLT in the last 168 hours. Coag's No results for input(s): APTT, INR in the last 168 hours. BMET No results for input(s): NA, K, CL, CO2, BUN, CREATININE, GLUCOSE in the last 168 hours. Electrolytes No results for input(s): CALCIUM, MG, PHOS in the last 168 hours. Sepsis Markers No results for input(s): LATICACIDVEN, PROCALCITON, O2SATVEN in the last 168 hours. ABG No results for input(s): PHART, PCO2ART, PO2ART in the last 168 hours. Liver Enzymes No results for input(s): AST, ALT, ALKPHOS, BILITOT, ALBUMIN in the last 168 hours. Cardiac Enzymes No results for input(s): TROPONINI, PROBNP in the last 168 hours. Glucose No results for input(s): GLUCAP in the last 168 hours.  Imaging No results found.  CXR:  Mild edema pattern, no L ptx, CVL, ETT, chest tube OK. Cannot visualize OGT  ASSESSMENT / PLAN:  PULMONARY A: Acute respiratory failure Pulmonary edema COPD with acute bronchospasm L pneumothorax - presumed due to chest compressions P:   Full vent support - settings reviewed and/or adjusted Vent bundle Daily SBT if/when meets criteria Nebulized steroids and BDs Chest tube @ -  20 cm H2O  CARDIOVASCULAR A:  NSTEMI Cardiac arrest Cardiogenic shock CHB post resuscitation Severe CAD Hyperlipidemia H/O hypertension P:  MAP goal 80 mmHg while hypothermic NE ordered to maintain MAP goal Came from [facility] on VP - will continue to minimize catecholamines in setting of recurrent ventricular arrhythmias Cont amiodarone Cycle cardiac markers Echocardiogram ordered Cardiology notified  ([place])  RENAL A:   CKD (Cr 1.74 in ED @ [facility]) P:   Monitor BMET intermittently Monitor I/Os Correct electrolytes as indicated Minimize nephrotoxins  GASTROINTESTINAL A:   Ileus due to shock and hypothermia P:   SUP: IV famotidine Hold TFs until rewarmed  HEMATOLOGIC A:   No issues P:  DVT px: Full dose UFH for ACS Monitor CBC intermittently Transfuse per usual ICU guidelines  INFECTIOUS A:   No identified infections P:   Micro and abx as above (top section of this note)  ENDOCRINE A:   Hyperglycemia without prior dx of DM Risk of dysglycemia while hypothermic P:   Sens scale SSI  NEUROLOGIC A:   Anoxic encephalopathy P:   RASS goal: N/A while on NMBs Hypothermia protocol continued Midaz and fent per protocol   FAMILY  - Updates:   - Inter-disciplinary family meet or Palliative Care meeting due by:  02/01    TODAY'S SUMMARY:   45 mins CCM time   Billy Fischeravid Simonds, MD ; [facility] service Mobile [phone].  After 5:30 PM or weekends, call [phone] Pulmonary and Critical Care Medicine [facility] Pager: [phone]  2015/05/13, 6:46 PM

## 2014-08-30 ENCOUNTER — Inpatient Hospital Stay (HOSPITAL_COMMUNITY): Payer: Medicare Other

## 2014-08-30 DIAGNOSIS — I2510 Atherosclerotic heart disease of native coronary artery without angina pectoris: Secondary | ICD-10-CM

## 2014-08-30 DIAGNOSIS — J449 Chronic obstructive pulmonary disease, unspecified: Secondary | ICD-10-CM

## 2014-08-30 DIAGNOSIS — I214 Non-ST elevation (NSTEMI) myocardial infarction: Principal | ICD-10-CM

## 2014-08-30 DIAGNOSIS — J9600 Acute respiratory failure, unspecified whether with hypoxia or hypercapnia: Secondary | ICD-10-CM | POA: Insufficient documentation

## 2014-08-30 DIAGNOSIS — J9801 Acute bronchospasm: Secondary | ICD-10-CM

## 2014-08-30 DIAGNOSIS — R0989 Other specified symptoms and signs involving the circulatory and respiratory systems: Secondary | ICD-10-CM

## 2014-08-30 DIAGNOSIS — G931 Anoxic brain damage, not elsewhere classified: Secondary | ICD-10-CM

## 2014-08-30 DIAGNOSIS — M7989 Other specified soft tissue disorders: Secondary | ICD-10-CM

## 2014-08-30 DIAGNOSIS — I998 Other disorder of circulatory system: Secondary | ICD-10-CM

## 2014-08-30 LAB — GLUCOSE, CAPILLARY
GLUCOSE-CAPILLARY: 237 mg/dL — AB (ref 70–99)
GLUCOSE-CAPILLARY: 242 mg/dL — AB (ref 70–99)
GLUCOSE-CAPILLARY: 217 mg/dL — AB (ref 70–99)
GLUCOSE-CAPILLARY: 205 mg/dL — AB (ref 70–99)
Glucose-Capillary: 144 mg/dL — ABNORMAL HIGH (ref 70–99)
Glucose-Capillary: 151 mg/dL — ABNORMAL HIGH (ref 70–99)
Glucose-Capillary: 191 mg/dL — ABNORMAL HIGH (ref 70–99)
Glucose-Capillary: 212 mg/dL — ABNORMAL HIGH (ref 70–99)
Glucose-Capillary: 227 mg/dL — ABNORMAL HIGH (ref 70–99)
Glucose-Capillary: 230 mg/dL — ABNORMAL HIGH (ref 70–99)
Glucose-Capillary: 219 mg/dL — ABNORMAL HIGH (ref 70–99)
Glucose-Capillary: 212 mg/dL — ABNORMAL HIGH (ref 70–99)
Glucose-Capillary: 243 mg/dL — ABNORMAL HIGH (ref 70–99)
Glucose-Capillary: 208 mg/dL — ABNORMAL HIGH (ref 70–99)

## 2014-08-30 LAB — CBC WITH DIFFERENTIAL/PLATELET
BASOS PCT: 0 % (ref 0–1)
Basophils Absolute: 0 10*3/uL (ref 0.0–0.1)
Eosinophils Absolute: 0 10*3/uL (ref 0.0–0.7)
Eosinophils Relative: 0 % (ref 0–5)
HEMATOCRIT: 45.1 % (ref 39.0–52.0)
HEMOGLOBIN: 15 g/dL (ref 13.0–17.0)
Lymphocytes Relative: 7 % — ABNORMAL LOW (ref 12–46)
Lymphs Abs: 1.3 10*3/uL (ref 0.7–4.0)
MCH: 30.9 pg (ref 26.0–34.0)
MCHC: 33.3 g/dL (ref 30.0–36.0)
MCV: 93 fL (ref 78.0–100.0)
Monocytes Absolute: 0.7 10*3/uL (ref 0.1–1.0)
Monocytes Relative: 4 % (ref 3–12)
NEUTROS ABS: 16.4 10*3/uL — AB (ref 1.7–7.7)
Neutrophils Relative %: 89 % — ABNORMAL HIGH (ref 43–77)
Platelets: 128 10*3/uL — ABNORMAL LOW (ref 150–400)
RBC: 4.85 MIL/uL (ref 4.22–5.81)
RDW: 13.6 % (ref 11.5–15.5)
WBC: 18.4 10*3/uL — AB (ref 4.0–10.5)

## 2014-08-30 LAB — MAGNESIUM: MAGNESIUM: 2.1 mg/dL (ref 1.5–2.5)

## 2014-08-30 LAB — COMPREHENSIVE METABOLIC PANEL
ALT: 164 U/L — AB (ref 0–53)
AST: 368 U/L — AB (ref 0–37)
Albumin: 2.5 g/dL — ABNORMAL LOW (ref 3.5–5.2)
Alkaline Phosphatase: 78 U/L (ref 39–117)
Anion gap: 19 — ABNORMAL HIGH (ref 5–15)
BUN: 20 mg/dL (ref 6–23)
CHLORIDE: 105 mmol/L (ref 96–112)
CO2: 23 mmol/L (ref 19–32)
CREATININE: 2.1 mg/dL — AB (ref 0.50–1.35)
Calcium: 8.9 mg/dL (ref 8.4–10.5)
GFR, EST AFRICAN AMERICAN: 38 mL/min — AB (ref >90)
GFR, EST NON AFRICAN AMERICAN: 33 mL/min — AB (ref >90)
Glucose, Bld: 255 mg/dL — ABNORMAL HIGH (ref 70–99)
POTASSIUM: 3.2 mmol/L — AB (ref 3.5–5.1)
Sodium: 147 mmol/L — ABNORMAL HIGH (ref 135–145)
Total Bilirubin: 1.4 mg/dL — ABNORMAL HIGH (ref 0.3–1.2)
Total Protein: 5.1 g/dL — ABNORMAL LOW (ref 6.0–8.3)

## 2014-08-30 LAB — POCT I-STAT 3, ART BLOOD GAS (G3+)
ACID-BASE DEFICIT: 10 mmol/L — AB (ref 0.0–2.0)
ACID-BASE DEFICIT: 11 mmol/L — AB (ref 0.0–2.0)
ACID-BASE DEFICIT: 12 mmol/L — AB (ref 0.0–2.0)
Acid-base deficit: 4 mmol/L — ABNORMAL HIGH (ref 0.0–2.0)
Acid-base deficit: 7 mmol/L — ABNORMAL HIGH (ref 0.0–2.0)
BICARBONATE: 14.9 meq/L — AB (ref 20.0–24.0)
Bicarbonate: 23.7 mEq/L (ref 20.0–24.0)
Bicarbonate: 20.2 mEq/L (ref 20.0–24.0)
Bicarbonate: 15.9 mEq/L — ABNORMAL LOW (ref 20.0–24.0)
Bicarbonate: 14.7 mEq/L — ABNORMAL LOW (ref 20.0–24.0)
O2 SAT: 82 %
O2 Saturation: 92 %
O2 Saturation: 81 %
O2 Saturation: 81 %
O2 Saturation: 90 %
PCO2 ART: 43.5 mmHg (ref 35.0–45.0)
PCO2 ART: 44.8 mmHg (ref 35.0–45.0)
PH ART: 7.305 — AB (ref 7.350–7.450)
PH ART: 7.307 — AB (ref 7.350–7.450)
PO2 ART: 38 mmHg — AB (ref 80.0–100.0)
Patient temperature: 33
Patient temperature: 33.1
Patient temperature: 33.2
Patient temperature: 32.8
TCO2: 25 mmol/L (ref 0–100)
TCO2: 21 mmol/L (ref 0–100)
TCO2: 17 mmol/L (ref 0–100)
TCO2: 16 mmol/L (ref 0–100)
TCO2: 16 mmol/L (ref 0–100)
pCO2 arterial: 28.7 mmHg — ABNORMAL LOW (ref 35.0–45.0)
pCO2 arterial: 28.5 mmHg — ABNORMAL LOW (ref 35.0–45.0)
pCO2 arterial: 27.8 mmHg — ABNORMAL LOW (ref 35.0–45.0)
pH, Arterial: 7.323 — ABNORMAL LOW (ref 7.350–7.450)
pH, Arterial: 7.261 — ABNORMAL LOW (ref 7.350–7.450)
pH, Arterial: 7.332 — ABNORMAL LOW (ref 7.350–7.450)
pO2, Arterial: 56 mmHg — ABNORMAL LOW (ref 80.0–100.0)
pO2, Arterial: 52 mmHg — ABNORMAL LOW (ref 80.0–100.0)
pO2, Arterial: 41 mmHg — ABNORMAL LOW (ref 80.0–100.0)
pO2, Arterial: 51 mmHg — ABNORMAL LOW (ref 80.0–100.0)

## 2014-08-30 LAB — HEPATIC FUNCTION PANEL
ALK PHOS: 67 U/L (ref 39–117)
ALT: 156 U/L — AB (ref 0–53)
AST: 290 U/L — ABNORMAL HIGH (ref 0–37)
Albumin: 2.3 g/dL — ABNORMAL LOW (ref 3.5–5.2)
BILIRUBIN DIRECT: 0.4 mg/dL (ref 0.0–0.5)
Indirect Bilirubin: 1.6 mg/dL — ABNORMAL HIGH (ref 0.3–0.9)
Total Bilirubin: 2 mg/dL — ABNORMAL HIGH (ref 0.3–1.2)
Total Protein: 4.6 g/dL — ABNORMAL LOW (ref 6.0–8.3)

## 2014-08-30 LAB — CBC
HCT: 44.2 % (ref 39.0–52.0)
HEMOGLOBIN: 14.7 g/dL (ref 13.0–17.0)
MCH: 30.4 pg (ref 26.0–34.0)
MCHC: 33.3 g/dL (ref 30.0–36.0)
MCV: 91.3 fL (ref 78.0–100.0)
Platelets: 133 10*3/uL — ABNORMAL LOW (ref 150–400)
RBC: 4.84 MIL/uL (ref 4.22–5.81)
RDW: 13.6 % (ref 11.5–15.5)
WBC: 16 10*3/uL — ABNORMAL HIGH (ref 4.0–10.5)

## 2014-08-30 LAB — BLOOD GAS, ARTERIAL
ACID-BASE DEFICIT: 6.7 mmol/L — AB (ref 0.0–2.0)
Bicarbonate: 19.1 mEq/L — ABNORMAL LOW (ref 20.0–24.0)
Drawn by: 39898
FIO2: 0.4 %
MECHVT: 600 mL
O2 Saturation: 89.2 %
PEEP: 5 cmH2O
Patient temperature: 88.7
RATE: 16 resp/min
TCO2: 20.5 mmol/L (ref 0–100)
pCO2 arterial: 33.9 mmHg — ABNORMAL LOW (ref 35.0–45.0)
pH, Arterial: 7.334 — ABNORMAL LOW (ref 7.350–7.450)
pO2, Arterial: 48.4 mmHg — ABNORMAL LOW (ref 80.0–100.0)

## 2014-08-30 LAB — BASIC METABOLIC PANEL
Anion gap: 19 — ABNORMAL HIGH (ref 5–15)
Anion gap: 16 — ABNORMAL HIGH (ref 5–15)
Anion gap: 20 — ABNORMAL HIGH (ref 5–15)
Anion gap: 25 — ABNORMAL HIGH (ref 5–15)
BUN: 18 mg/dL (ref 6–23)
BUN: 23 mg/dL (ref 6–23)
BUN: 27 mg/dL — ABNORMAL HIGH (ref 6–23)
BUN: 24 mg/dL — ABNORMAL HIGH (ref 6–23)
CALCIUM: 9.5 mg/dL (ref 8.4–10.5)
CALCIUM: 8.6 mg/dL (ref 8.4–10.5)
CO2: 24 mmol/L (ref 19–32)
CO2: 20 mmol/L (ref 19–32)
CO2: 12 mmol/L — ABNORMAL LOW (ref 19–32)
CO2: 5 mmol/L — CL (ref 19–32)
CREATININE: 2.15 mg/dL — AB (ref 0.50–1.35)
Calcium: 8 mg/dL — ABNORMAL LOW (ref 8.4–10.5)
Calcium: 8.1 mg/dL — ABNORMAL LOW (ref 8.4–10.5)
Chloride: 102 mmol/L (ref 96–112)
Chloride: 109 mmol/L (ref 96–112)
Chloride: 110 mmol/L (ref 96–112)
Chloride: 114 mmol/L — ABNORMAL HIGH (ref 96–112)
Creatinine, Ser: 1.9 mg/dL — ABNORMAL HIGH (ref 0.50–1.35)
Creatinine, Ser: 1.74 mg/dL — ABNORMAL HIGH (ref 0.50–1.35)
Creatinine, Ser: 1.74 mg/dL — ABNORMAL HIGH (ref 0.50–1.35)
GFR calc Af Amer: 37 mL/min — ABNORMAL LOW (ref >90)
GFR calc non Af Amer: 32 mL/min — ABNORMAL LOW (ref >90)
GFR calc non Af Amer: 37 mL/min — ABNORMAL LOW (ref >90)
GFR calc non Af Amer: 41 mL/min — ABNORMAL LOW (ref >90)
GFR calc non Af Amer: 41 mL/min — ABNORMAL LOW (ref >90)
GFR, EST AFRICAN AMERICAN: 43 mL/min — AB (ref >90)
GFR, EST AFRICAN AMERICAN: 48 mL/min — AB (ref >90)
GFR, EST AFRICAN AMERICAN: 48 mL/min — AB (ref >90)
GLUCOSE: 261 mg/dL — AB (ref 70–99)
Glucose, Bld: 249 mg/dL — ABNORMAL HIGH (ref 70–99)
Glucose, Bld: 234 mg/dL — ABNORMAL HIGH (ref 70–99)
Glucose, Bld: 126 mg/dL — ABNORMAL HIGH (ref 70–99)
POTASSIUM: 3.4 mmol/L — AB (ref 3.5–5.1)
Potassium: 3.1 mmol/L — ABNORMAL LOW (ref 3.5–5.1)
Potassium: 2.9 mmol/L — ABNORMAL LOW (ref 3.5–5.1)
Potassium: 5.5 mmol/L — ABNORMAL HIGH (ref 3.5–5.1)
SODIUM: 145 mmol/L (ref 135–145)
SODIUM: 145 mmol/L (ref 135–145)
Sodium: 142 mmol/L (ref 135–145)
Sodium: 144 mmol/L (ref 135–145)

## 2014-08-30 LAB — LACTIC ACID, PLASMA: Lactic Acid, Venous: 11.5 mmol/L (ref 0.5–2.0)

## 2014-08-30 LAB — POCT I-STAT, CHEM 8
BUN: 19 mg/dL (ref 6–23)
CALCIUM ION: 1.5 mmol/L — AB (ref 1.12–1.23)
CHLORIDE: 104 mmol/L (ref 96–112)
Creatinine, Ser: 1.5 mg/dL — ABNORMAL HIGH (ref 0.50–1.35)
Glucose, Bld: 305 mg/dL — ABNORMAL HIGH (ref 70–99)
HCT: 37 % — ABNORMAL LOW (ref 39.0–52.0)
Hemoglobin: 12.6 g/dL — ABNORMAL LOW (ref 13.0–17.0)
POTASSIUM: 2.3 mmol/L — AB (ref 3.5–5.1)
Sodium: 147 mmol/L — ABNORMAL HIGH (ref 135–145)
TCO2: 21 mmol/L (ref 0–100)

## 2014-08-30 LAB — TROPONIN I
TROPONIN I: 46.18 ng/mL — AB (ref <0.031)
TROPONIN I: 43.59 ng/mL — AB (ref <0.031)
Troponin I: 40.64 ng/mL (ref <0.031)

## 2014-08-30 LAB — CORTISOL: CORTISOL PLASMA: 64.8 ug/dL

## 2014-08-30 LAB — BRAIN NATRIURETIC PEPTIDE: B Natriuretic Peptide: 1463.9 pg/mL — ABNORMAL HIGH (ref 0.0–100.0)

## 2014-08-30 LAB — APTT: aPTT: 127 seconds — ABNORMAL HIGH (ref 24–37)

## 2014-08-30 LAB — AMYLASE: AMYLASE: 98 U/L (ref 0–105)

## 2014-08-30 LAB — PROTIME-INR
INR: 1.85 — AB (ref 0.00–1.49)
Prothrombin Time: 21.6 seconds — ABNORMAL HIGH (ref 11.6–15.2)

## 2014-08-30 LAB — PROCALCITONIN
Procalcitonin: 33.46 ng/mL
Procalcitonin: 41.71 ng/mL

## 2014-08-30 LAB — LIPASE, BLOOD: Lipase: 25 U/L (ref 11–59)

## 2014-08-30 LAB — HEPARIN LEVEL (UNFRACTIONATED)
HEPARIN UNFRACTIONATED: 0.7 [IU]/mL (ref 0.30–0.70)
Heparin Unfractionated: 0.33 IU/mL (ref 0.30–0.70)

## 2014-08-30 LAB — STREP PNEUMONIAE URINARY ANTIGEN: Strep Pneumo Urinary Antigen: NEGATIVE

## 2014-08-30 LAB — PHOSPHORUS: Phosphorus: 5 mg/dL — ABNORMAL HIGH (ref 2.3–4.6)

## 2014-08-30 LAB — TSH: TSH: 2 u[IU]/mL (ref 0.350–4.500)

## 2014-08-30 MED ORDER — CHLORHEXIDINE GLUCONATE 0.12 % MT SOLN
15.0000 mL | Freq: Two times a day (BID) | OROMUCOSAL | Status: DC
  Administered 2014-08-30 (×2): 15 mL via OROMUCOSAL
  Filled 2014-08-30 (×2): qty 15

## 2014-08-30 MED ORDER — SODIUM CHLORIDE 0.9 % IV SOLN
INTRAVENOUS | Status: DC
  Administered 2014-08-30: 2.7 [IU]/h via INTRAVENOUS
  Filled 2014-08-30: qty 2.5

## 2014-08-30 MED ORDER — POTASSIUM CHLORIDE 10 MEQ/50ML IV SOLN
10.0000 meq | INTRAVENOUS | Status: DC
  Administered 2014-08-30 (×5): 10 meq via INTRAVENOUS
  Filled 2014-08-30 (×4): qty 50

## 2014-08-30 MED ORDER — VANCOMYCIN HCL 10 G IV SOLR
1750.0000 mg | Freq: Once | INTRAVENOUS | Status: AC
  Administered 2014-08-30: 1750 mg via INTRAVENOUS
  Filled 2014-08-30: qty 1750

## 2014-08-30 MED ORDER — SODIUM BICARBONATE 8.4 % IV SOLN
INTRAVENOUS | Status: AC
  Administered 2014-08-30: 50 meq via INTRAVENOUS
  Filled 2014-08-30: qty 50

## 2014-08-30 MED ORDER — INSULIN ASPART 100 UNIT/ML ~~LOC~~ SOLN: 0.0000 [IU] | SUBCUTANEOUS | Status: DC

## 2014-08-30 MED ORDER — SODIUM BICARBONATE 8.4 % IV SOLN
50.0000 meq | Freq: Once | INTRAVENOUS | Status: AC
  Administered 2014-08-30: 50 meq via INTRAVENOUS

## 2014-08-30 MED ORDER — LEVOFLOXACIN IN D5W 750 MG/150ML IV SOLN
750.0000 mg | INTRAVENOUS | Status: DC
  Administered 2014-08-30: 750 mg via INTRAVENOUS
  Filled 2014-08-30: qty 150

## 2014-08-30 MED ORDER — CETYLPYRIDINIUM CHLORIDE 0.05 % MT LIQD
7.0000 mL | Freq: Four times a day (QID) | OROMUCOSAL | Status: DC
  Administered 2014-08-30 – 2014-08-31 (×4): 7 mL via OROMUCOSAL

## 2014-08-30 MED ORDER — INSULIN REGULAR HUMAN 100 UNIT/ML IJ SOLN
INTRAMUSCULAR | Status: DC
  Administered 2014-08-30: 1.5 [IU]/h via INTRAVENOUS
  Filled 2014-08-30: qty 2.5

## 2014-08-30 MED ORDER — HYDROCORTISONE NA SUCCINATE PF 100 MG IJ SOLR
50.0000 mg | Freq: Four times a day (QID) | INTRAMUSCULAR | Status: DC
  Administered 2014-08-30 (×3): 50 mg via INTRAVENOUS
  Filled 2014-08-30 (×8): qty 1

## 2014-08-30 MED ORDER — CEFTRIAXONE SODIUM IN DEXTROSE 20 MG/ML IV SOLN
1.0000 g | INTRAVENOUS | Status: DC
  Administered 2014-08-30: 1 g via INTRAVENOUS
  Filled 2014-08-30 (×2): qty 50

## 2014-08-30 MED ORDER — DEXTROSE 5 % IV SOLN
INTRAVENOUS | Status: DC
  Administered 2014-08-31: via INTRAVENOUS
  Filled 2014-08-30: qty 150

## 2014-08-30 MED ORDER — VANCOMYCIN HCL 10 G IV SOLR
1250.0000 mg | INTRAVENOUS | Status: DC
  Filled 2014-08-30: qty 1250

## 2014-08-30 MED ORDER — SODIUM CHLORIDE 0.9 % IV SOLN
INTRAVENOUS | Status: DC
  Administered 2014-08-30: 18:00:00 via INTRAVENOUS

## 2014-08-30 MED FILL — Medication: Qty: 1 | Status: AC

## 2014-08-30 NOTE — Progress Notes (Signed)
eLink Physician-Brief Progress Note Patient Name: Warden Fillersdward M Wessels DOB: 1956/06/02 MRN: [id number]   Date of Service  08/30/2014  HPI/Events of Note  Pt with progressive anion gap acidosis, hypotension, and now with paced rhythm at 55.   eICU Interventions  Will try giving bicarb.  On max tx otherwise.  DNR.  Not likely to survive.      Intervention Category Major Interventions: Other:  Jerelyn Trimarco 08/30/2014, 11:52 PM

## 2014-08-30 NOTE — Progress Notes (Signed)
Unable to doppler pulses in pt's rt leg. CCM made aware.

## 2014-08-30 NOTE — Progress Notes (Signed)
EEG completed; results pending.    

## 2014-08-30 NOTE — Progress Notes (Addendum)
ANTICOAGULATION CONSULT NOTE Pharmacy Consult for Heparin Indication: chest pain/ACS  No Known Allergies  Patient Measurements: Height: 5\' 9"  (175.3 cm) Weight: 201 lb 4.5 oz (91.3 kg) IBW/kg (Calculated) : 70.7 Heparin Dosing Weight: 88 kg  Vital Signs: Temp: 90.9 F (32.7 C) (01/26 0900) Temp Source: Core (Comment) (01/26 0900) BP: 100/67 mmHg (01/26 0900) Pulse Rate: 87 (01/26 0500)  Labs:  Recent Labs  08/19/2014 2005  09/01/2014 2139  08/09/2014 2252 08/30/14 0113 08/30/14 0239 08/30/14 0405 08/30/14 0520 08/30/14 0800 08/30/14 0825  HGB  --   --   --   < > 12.6* 15.0  --  14.7  --   --   --   HCT  --   --   --   --  37.0* 45.1  --  44.2  --   --   --   PLT  --   --  140*  --   --  128*  --  133*  --   --   --   APTT 36  --  46*  --   --  127*  --   --   --   --   --   LABPROT 21.6*  --  21.8*  --   --  21.6*  --   --   --   --   --   INR 1.86*  --  1.88*  --   --  1.85*  --   --   --   --   --   HEPARINUNFRC  --   --   --   --   --  0.33  --   --   --  0.70  --   CREATININE 2.08*  < >  --   --  1.50*  --  2.15*  --  2.10*  --  1.90*  TROPONINI 37.78*  --   --   --   --  46.18*  --   --  43.59*  --  40.64*  < > = values in this interval not displayed.  Estimated Creatinine Clearance: 46.7 mL/min (by C-G formula based on Cr of 1.9).  Assessment: 59 yo male with VT/VF arrest s/p cath, currently on hypothermia protocol on heparin and heparin level is at the high end of goal on 1200 units/hr (prior level was 0.3 on the same rate). Rewarming will begin at 7:30 tonight.  Goal of Therapy:  Heparin level 0.3-0.7 units/ml Monitor platelets by anticoagulation protocol: Yes   Plan: -Decrease heparin slightly to 1150 units/hr -Heparin level 6 hours after rewarming begins  Harland Germanndrew Gaylene Moylan, Pharm D 08/30/2014 10:09 AM   Addendum -heparin off from 10am to 12 for chest tube placement  Plan -Will restart heparin at 1200 units/hr -recheck a heparin level 6 hours after  rewarming begins  Harland Germanndrew Tuvia Woodrick, Pharm D 08/30/2014 12:02 PM

## 2014-08-30 NOTE — Procedures (Addendum)
Chest Tube Insertion Procedure Note  Indications:  Clinically significant Pneumothorax  Pre-operative Diagnosis: Pneumothorax  Post-operative Diagnosis: Pneumothorax  Procedure Details  Informed consent was obtained for the procedure, including sedation.  Risks of lung perforation, hemorrhage, arrhythmia, and adverse drug reaction were discussed.   After sterile skin prep, using standard technique, a 28 French tube was placed in the left lateral 6 rib space.  Findings: Air out  Estimated Blood Loss:  Minimal         Specimens:  None              Complications:  None; patient tolerated the procedure well.         Disposition: ICU - intubated and critically ill.         Condition: unstable  Attending Attestation: I performed the procedure.  Tolerated well I primary closed old site witgh 3 elicon  Mcarthur RossettiDaniel J. Tyson AliasFeinstein, MD, FACP Pgr: [phone] [place] [facility]

## 2014-08-30 NOTE — Progress Notes (Signed)
Wasted 60 ml fentanyl in sink with Air traffic controllerJody RN.

## 2014-08-30 NOTE — Progress Notes (Signed)
PROGRESS NOTE  Subjective:    [name] is a 59 yo [facility], cardiac arrest.  Hx of HTN, heavy smoker, hyperlipidemia.   He developed severe shortness of breath yesterday and his wife called EMS. [facility] and was emergently taken to the Cath Lab. [facility]  He has an 80% LAD stenosis, his left circumflex and right coronary arteries are occluded. His left ventricular gram revealed EF of around 40%. He had a cardiac arrest on the cath table and received 10:15 minutes of CPR. He also has complete heart block and temper a pacing wire was placed.  Following CPR he was found have it pneumothorax by chest x-ray. A chest tube was inserted.  [facility]  I discussed the case with Dr. Tyson AliasFeinstein this morning. His chest tube has slipped out resulting in recurrence of his left pneumothorax.  He has an ischemic right foot-presumably due to a consultation from the cardiac catheterization.  He's intubated and sedated on the ventilator. He's been cooled using the [employer]  Protocol. His wife was at the bedside. We answered all of her questions.  Objective:    Vital Signs:   Temp:  [86.4 F (30.2 C)-92.1 F (33.4 C)] 90.9 F (32.7 C) (01/26 0900) Pulse Rate:  [55-110] 87 (01/26 0500) Resp:  [16-19] 16 (01/26 0900) BP: (100-173)/(64-148) 100/67 mmHg (01/26 0900) SpO2:  [90 %-100 %] 92 % (01/26 0900) Arterial Line BP: (114-178)/(67-84) 114/67 mmHg (01/26 0900) FiO2 (%):  [40 %-60 %] 60 % (01/26 0959) Weight:  [201 lb 4.5 oz (91.3 kg)] 201 lb 4.5 oz (91.3 kg) (01/25 1814)      24-hour weight change: Weight change:   Weight trends: Filed Weights   August 12, 2014 1814  Weight: 201 lb 4.5 oz (91.3 kg)    Intake/Output:  01/25 0701 - 01/26 0700 In: 2309.2 [I.V.:2159.2; IV Piggyback:150] Out: 2270 [Urine:2075; Chest Tube:195] Total I/O In: 737.6 [I.V.:737.6] Out: 375 [Urine:375]   Physical  Exam: BP 100/67 mmHg  Pulse 87  Temp(Src) 90.9 F (32.7 C) (Core (Comment))  Resp 16  Ht 5\' 9"  (1.753 m)  Wt 201 lb 4.5 oz (91.3 kg)  BMI 29.71 kg/m2  SpO2 92%  Wt Readings from Last 3 Encounters:  August 12, 2014 201 lb 4.5 oz (91.3 kg)    General: Vital signs reviewed and noted. Chronically ill appearing   Head: Normocephalic, atraumatic.  Eyes: conjunctivae/corneas clear.  EOM's intact.   Throat: normal  Neck:  ETT in place   Lungs:     Decreased  Breath sounds on left   Heart:  RR   Abdomen:  Soft, non-tender, non-distended   Extremities: His right foot is cyanotic    Neurologic: Sedated   Psych: Unable to assess    Labs: BMET:  Recent Labs  08/30/14 0520 08/30/14 0825  NA 147* 145  K 3.2* 2.9*  CL 105 109  CO2 23 20  GLUCOSE 255* 261*  BUN 20 23  CREATININE 2.10* 1.90*  CALCIUM 8.9 8.6    Liver function tests:  Recent Labs  08/30/14 0520  AST 368*  ALT 164*  ALKPHOS 78  BILITOT 1.4*  PROT 5.1*  ALBUMIN 2.5*   No results for input(s): LIPASE, AMYLASE in the last 72 hours.  CBC:  Recent Labs  08/30/14 0113 08/30/14 0405  WBC 18.4* 16.0*  NEUTROABS 16.4*  --   HGB 15.0 14.7  HCT 45.1 44.2  MCV 93.0 91.3  PLT 128* 133*    Cardiac Enzymes:  Recent Labs  08/10/2014 2005 08/30/14 0113 08/30/14 0520 08/30/14 0825  TROPONINI 37.78* 46.18* 43.59* 40.64*    Coagulation Studies:  Recent Labs  08/26/2014 2005 08/07/2014 2139 08/30/14 0113  LABPROT 21.6* 21.8* 21.6*  INR 1.86* 1.88* 1.85*    Other: Invalid input(s): POCBNP  Recent Labs  08/28/2014 2139  DDIMER >20.00*   No results for input(s): HGBA1C in the last 72 hours. No results for input(s): CHOL, HDL, LDLCALC, TRIG, CHOLHDL in the last 72 hours. No results for input(s): TSH, T4TOTAL, T3FREE, THYROIDAB in the last 72 hours.  Invalid input(s): FREET3 No results for input(s): VITAMINB12, FOLATE, FERRITIN, TIBC, IRON, RETICCTPCT in the last 72 hours.   Other results:  EKG :     NSR , RBBB  Medications:    Infusions: . sodium chloride 10 mL/hr at 08/30/14 0900  . amiodarone 30 mg/hr (08/30/14 0900)  . cisatracurium (NIMBEX) infusion 1.004 mcg/kg/min (08/30/14 0900)  . DOPamine 5.024 mcg/kg/min (08/30/14 0900)  . fentaNYL infusion INTRAVENOUS 100 mcg/hr (08/30/14 0900)  . heparin 1,200 Units/hr (08/30/14 0900)  . insulin (NOVOLIN-R) infusion 3.4 mL/hr at 08/30/14 0900  . midazolam (VERSED) infusion 2 mg/hr (08/30/14 0900)  . norepinephrine (LEVOPHED) Adult infusion 34.987 mcg/min (08/30/14 0900)  . vasopressin (PITRESSIN) infusion - *FOR SHOCK* 0.03 Units/min (08/30/14 0900)    Scheduled Medications: . sodium chloride  2,000 mL Intravenous Once  . antiseptic oral rinse  7 mL Mouth Rinse QID  . artificial tears  1 application Both Eyes 3 times per day  . aspirin  300 mg Rectal Daily  . budesonide  0.25 mg Nebulization Q6H  . chlorhexidine  15 mL Mouth Rinse BID  . DOPamine      . famotidine (PEPCID) IV  20 mg Intravenous Q12H  . hydrocortisone sod succinate (SOLU-CORTEF) inj  50 mg Intravenous Q6H  . ipratropium-albuterol  3 mL Nebulization Q6H  . midazolam  2 mg Intravenous Once  . potassium chloride  10 mEq Intravenous Q1 Hr x 6  . potassium chloride       I've reviewed the chest x-ray. I agree with the findings of a left-sided pneumothorax.  Assessment/ Plan:      1. Cardiac arrest:  This occurred in the setting of an acute non-ST segment myocardial infarction. At present he is stable. He's currently on amiodarone. He's on various pressors.  He's being cooled using the [place]  sun protocol. Continue supportive care.  2. CAD :  Severe three-vessel coronary artery disease-not amenable to antiplastic. The cardiac surgeons have been contacted. They'll see him later today.  Hopefully he will be able to go for CABG as he improves over the next several days.   3. COPD: He has a history of heavy cigarette smoking.  4. Pneumothorax: The patient has a new  with her os on the left side.  Further management per Dr. Tyson Alias.  5. Recent complete heart block: The patient is normal sinus rhythm now. His PR intervals normal. There is no evidence of worsening heart block. At this point I would leave the pacer in place. It does not appear to be causing any difficulty. I suspect that we will be up pulled the pacer tomorrow.  I would not try to advance it at this time.  6. Right bundle branch block:      Disposition:  Length of Stay: 1  Alvia Grove., MD, [facility]  08/30/2014, 10:14 AM Office [phone] Pager [phone]

## 2014-08-30 NOTE — Progress Notes (Addendum)
ANTIBIOTIC CONSULT NOTE - INITIAL  Pharmacy Consult for vancomycin, levaquin, rocephin Indication: PNA  No Known Allergies  Patient Measurements: Height:  (175.3 cm) Weight: 201 lb 4.5 oz (91.3 kg) IBW/kg (Calculated) : 70.7   Vital Signs: Temp: 91.2 F (32.9 C) (01/26 1000) Temp Source: Core (Comment) (01/26 1000) BP: 83/66 mmHg (01/26 1000) Pulse Rate: 87 (01/26 0500) Intake/Output from previous day: 01/25 0701 - 01/26 0700 In: 2309.2 [I.V.:2159.2; IV Piggyback:150] Out: 2270 [Urine:2075; Chest Tube:195] Intake/Output from this shift: Total I/O In: 839.2 [I.V.:839.2] Out: 455 [Urine:455]  Labs:  Recent Labs  2014-09-27 2139 09/27/14 2252 08/30/14 0113 08/30/14 0239 08/30/14 0405 08/30/14 0520 08/30/14 0825  WBC  --   --  18.4*  --  16.0*  --   --   HGB  --  12.6* 15.0  --  14.7  --   --   PLT 140*  --  128*  --  133*  --   --   CREATININE  --  1.50*  --  2.15*  --  2.10* 1.90*   Estimated Creatinine Clearance: 46.7 mL/min (by C-G formula based on Cr of 1.9). No results for input(s): VANCOTROUGH, VANCOPEAK, VANCORANDOM, GENTTROUGH, GENTPEAK, GENTRANDOM, TOBRATROUGH, TOBRAPEAK, TOBRARND, AMIKACINPEAK, AMIKACINTROU, AMIKACIN in the last 72 hours.   Microbiology: Recent Results (from the past 720 hour(s))  MRSA PCR Screening     Status: None   Collection Time: 09/27/2014  6:30 PM  Result Value Ref Range Status   MRSA by PCR NEGATIVE NEGATIVE Final    Comment:        The GeneXpert MRSA Assay (FDA approved for NASAL specimens only), is one component of a comprehensive MRSA colonization surveillance program. It is not intended to diagnose MRSA infection nor to guide or monitor treatment for MRSA infections.     Medical History: No past medical history on file.  Medications:  Prescriptions prior to admission  Medication Sig Dispense Refill Last Dose  . divalproex (DEPAKOTE ER) 500 MG 24 hr tablet Take 1,000 mg by mouth 2 (two) times daily. Pharmacy  states that it is ER 2 tabs BID   unknown at unknown  . escitalopram (LEXAPRO) 10 MG tablet Take 10 mg by mouth daily.   08/28/2014 at Unknown time  . HYDROcodone-acetaminophen (NORCO) 7.5-325 MG per tablet Take 1 tablet by mouth every 6 (six) hours as needed for moderate pain.   08/28/2014 at Unknown time  . lisinopril (PRINIVIL,ZESTRIL) 40 MG tablet Take 40 mg by mouth daily.   08/28/2014 at Unknown time   Scheduled:  . sodium chloride  2,000 mL Intravenous Once  . antiseptic oral rinse  7 mL Mouth Rinse QID  . artificial tears  1 application Both Eyes 3 times per day  . aspirin  300 mg Rectal Daily  . budesonide  0.25 mg Nebulization Q6H  . chlorhexidine  15 mL Mouth Rinse BID  . famotidine (PEPCID) IV  20 mg Intravenous Q12H  . hydrocortisone sod succinate (SOLU-CORTEF) inj  50 mg Intravenous Q6H  . ipratropium-albuterol  3 mL Nebulization Q6H  . levofloxacin (LEVAQUIN) IV  750 mg Intravenous Q48H  . midazolam  2 mg Intravenous Once  . potassium chloride      . [START ON 08/22/2014] vancomycin  1,250 mg Intravenous Q24H  . vancomycin  1,750 mg Intravenous Once   Assessment: 59 yo male s/p cardiac arrest on hypothermia protocol and with concern of PNA to begin levaquin, rocephin and vancomycin. WBC= 16, SCr= 1.9, CrCl ~  45. He is noted on multiple pressors.  1/26 levaquin 1/26 vanc>> 1/26 rocephin  1/26 blood x2 1/26 resp  Goal of Therapy:  Vancomycin trough level 15-20 mcg/ml  Plan:  -Levaquin 750mg  IV q48hr -Vancomycin 1750mg  IV x1 followed by 1250mg  IV q24hr -Rocephin 1gm IV q24hr -Will follow renal function, cultures and clinical progress  Harland Germanndrew Emerson Barretto, Pharm D 08/30/2014 11:05 AM

## 2014-08-30 NOTE — Progress Notes (Signed)
ANTICOAGULATION CONSULT NOTE Pharmacy Consult for Heparin Indication: chest pain/ACS  No Known Allergies  Patient Measurements: Height: 5\' 9"  (175.3 cm) Weight: 201 lb 4.5 oz (91.3 kg) IBW/kg (Calculated) : 70.7 Heparin Dosing Weight: 88 kg  Vital Signs: Temp: 92.1 F (33.4 C) (01/26 0000) Temp Source: Core (Comment) (01/26 0000) BP: 135/80 mmHg (01/26 0000) Pulse Rate: 76 (01/26 0005)  Labs:  Recent Labs  08/30/2014 2005 09/02/2014 2138 08/25/2014 2139 08/30/14 0113  HGB  --   --   --  15.0  HCT  --   --   --  45.1  PLT  --   --  140* 128*  APTT 36  --  46* 127*  LABPROT 21.6*  --  21.8* 21.6*  INR 1.86*  --  1.88* 1.85*  HEPARINUNFRC  --   --   --  0.33  CREATININE 2.08* 2.20*  --   --   TROPONINI 37.78*  --   --  46.18*    Estimated Creatinine Clearance: 40.3 mL/min (by C-G formula based on Cr of 2.2).  Assessment: 59 yo male with VT/VF arrest s/p cath, currently on hypothermia protocol, for heparin  Goal of Therapy:  Heparin level 0.3-0.7 units/ml Monitor platelets by anticoagulation protocol: Yes   Plan: Continue Heparin at current rate  Recheck level in 6 hrs to verify  Geannie RisenGreg Uri Turnbough, PharmD, BCPS   08/30/2014,2:08 AM

## 2014-08-30 NOTE — Procedures (Signed)
ELECTROENCEPHALOGRAM REPORT  Patient: Dustin Chavez Volpe       Room #: [id number] EEG No. ID: [id number] Age: 59 y.o.        Sex: male Referring Physician: Tyson AliasFEINSTEIN, D Report Date:  08/30/2014        Interpreting Physician: Aline BrochureSTEWART,Airam Heidecker R  History: Dustin Chavez Swartz is an 59 y.o. male history of with a history of coronary artery disease as well as peripheral vascular disease who suffered a cardiac arrest on 08/29/2013 with 10-15 minutes of CPR time. Patient is currently undergoing hyperthermia protocol.  Indications for study:  Assess severity of encephalopathy; rule out seizure activity.  Technique: This is an 18 channel routine scalp EEG performed at the bedside with bipolar and monopolar montages arranged in accordance to the international 10/20 system of electrode placement.   Description: Patient was intubated and on mechanical ventilation at the time the study. He was also sedated with Versed and fentanyl. Body temperature was 33.5 Celsius. Cerebral activity was markedly suppressed and amplitude diffusely with minimal mixed frequency activity recorded intermittently, with a pattern suggestive of burst-suppression. Study was performed with increased sensitivity setting. No epileptiform activity was seen.  Interpretation: This EEG recording showed markedly suppressed cerebral activity diffusely which was likely due to multiple factors, including hyperthermia and sedating medications, as well as possible diffuse hypoxic brain injury. Repeat study following completion of hyperthermia treatment is recommended for more reliable clinical assessment and prognosis.   Venetia MaxonR Makinsey Pepitone Chavez.D. [facility] [phone]

## 2014-08-30 NOTE — Procedures (Signed)
I have had extensive discussions with family wofe. We discussed patients current circumstances and organ failures. We also discussed patient's prior wishes under circumstances such as this. Family has decided to NOT perform resuscitation if arrest but to continue current medical support for now. Shock ok  Mcarthur Rossettianiel J. Tyson AliasFeinstein, MD, FACP Pgr: [phone] [facility]

## 2014-08-30 NOTE — H&P (Signed)
PULMONARY / CRITICAL CARE MEDICINE   Name: SHYHIEM BEENEY MRN: [id number] DOB: [birth year]    ADMISSION DATE:  08/17/2014  REFERRING MD :  [facility] R[facility])  INITIAL PRESENTATION:  44 M with multiple coronary RFs was admitted in transfer from [facility] where he suffered multiple VT/VF arrests and where he underwent cardiac catheterization which demonstrated severe 3VD. He was intubated and hypothermia started at [facility]. There is documentation of 15 mins of CPR in the cath lab prior to performance of LHC  SIGNIFICANT EVENTS/STUDIES:  1/25 Presented to [facility] ED with dyspnea X 24 hrs. Suffered cardiac arrest in ED. Intubated and hypothermia protocol initiated. CHB after ACLS. TPM placed. Cardiology consulted and taken to cath lab urgently - findings as noted below. L pneumothorax suffered during CPR. L chest tube placed 1/25 LHC ([place]): 1005 occluded RCA, LCx. 80 % mid LAD. LVEF 40% 1/25 Transferred to [facility] CCU for further eval and mgmt. Hypothermia protocol continued 1/25 Cards Consult:  1/25 TTE:  1/26 EEG:   INDWELLING DEVICES:: ETT 1/25 >>  R IJ CVL 1/25 >>  L chest tube 1/25 >> migrates out 1/26 Femoral arterial cath 1/25 >>  R femoral TPM 1/25 >>   MICRO DATA: None   ANTIMICROBIALS:  None   SUBJECTIVE: PTX increased, CT malplaced  VITAL SIGNS: Temp:  [86.4 F (30.2 C)-92.1 F (33.4 C)] 90.9 F (32.7 C) (01/26 0900) Pulse Rate:  [55-110] 87 (01/26 0500) Resp:  [16-19] 16 (01/26 0900) BP: (100-173)/(64-148) 100/67 mmHg (01/26 0900) SpO2:  [90 %-100 %] 92 % (01/26 0900) Arterial Line BP: (114-178)/(67-84) 114/67 mmHg (01/26 0900) FiO2 (%):  [40 %] 40 % (01/26 0911) Weight:  [91.3 kg (201 lb 4.5 oz)] 91.3 kg (201 lb 4.5 oz) (01/25 1814) HEMODYNAMICS: CVP:  [5 mmHg-15 mmHg] 7 mmHg VENTILATOR SETTINGS: Vent Mode:  [-] PRVC FiO2 (%):  [40 %] 40 % Set Rate:  [16 bmp] 16 bmp Vt Set:  [600 mL] 600 mL PEEP:  [5 cmH20] 5  cmH20 Plateau Pressure:  [19 cmH20-22 cmH20] 22 cmH20 INTAKE / OUTPUT:  Intake/Output Summary (Last 24 hours) at 08/30/14 0925 Last data filed at 08/30/14 0900  Gross per 24 hour  Intake 3046.8 ml  Output   2645 ml  Net  401.8 ml    PHYSICAL EXAMINATION: General: sedated, intubated, unresponsive Neuro:  No spont movement, cooled, paralzyed HEENT: NCAT, WNL Cardiovascular: SR 90, s1 s2 NOT paced Lungs:  coarse Abdomen:  Soft, diminished BS Ext: cool, rt foot cool, no pulse, cyanotic  LABS:  CBC  Recent Labs Lab 08/18/2014 2139 08/30/14 0113 08/30/14 0405  WBC  --  18.4* 16.0*  HGB  --  15.0 14.7  HCT  --  45.1 44.2  PLT 140* 128* 133*   Coag's  Recent Labs Lab 08/05/2014 2005 08/27/2014 2139 08/30/14 0113  APTT 36 46* 127*  INR 1.86* 1.88* 1.85*   BMET  Recent Labs Lab 08/30/14 0239 08/30/14 0520 08/30/14 0825  NA 145 147* 145  K 3.1* 3.2* 2.9*  CL 102 105 109  CO2 BUN CREATININE 2.15* 2.10* 1.90*  GLUCOSE 249* 255* 261*   Electrolytes  Recent Labs Lab 08/30/14 0239 08/30/14 0520 08/30/14 0825  CALCIUM 9.5 8.9 8.6   Sepsis Markers  Recent Labs Lab 08/16/2014 2258 08/24/2014 2304 08/30/14 0405  LATICACIDVEN  --  11.5*  --   PROCALCITON 33.46  --  41.71   ABG  Recent Labs Lab 2014-09-04 2030 2014-09-04 2127  PHART 7.200* 7.334*  PCO2ART 40.7 33.9*  PO2ART 104.0* 48.4*   Liver Enzymes  Recent Labs Lab 08/30/14 0520  AST 368*  ALT 164*  ALKPHOS 78  BILITOT 1.4*  ALBUMIN 2.5*   Cardiac Enzymes  Recent Labs Lab 08/30/14 0113 08/30/14 0520 08/30/14 0825  TROPONINI 46.18* 43.59* 40.64*   Glucose  Recent Labs Lab 08/30/14 0222 08/30/14 0305 08/30/14 0624 08/30/14 0737 08/30/14 0819 08/30/14 0909  GLUCAP 191* 212* 230* 219* 243* 212*    Imaging Dg Chest Port 1 View  08/30/2014   CLINICAL DATA:  59 year old male. Evaluate pacemaker leads. Intubated. Shortness breath. Post cardiac arrest.  Subsequent encounter.  EXAM: PORTABLE CHEST - 1 VIEW  COMPARISON:  September 04, 2014.  FINDINGS: Endotracheal tube tip 4.8 cm above the carina.  Right central line tip proximal superior vena cava level.  Left-sided chest tube remains in place with the side hole outside the thoracic cage. Left apical pneumothorax similar to the prior exam (approximately 15%).  Cardiac pad is in place. Femoral transvenous pacer tip projects over the heart and may be close to the tricuspid valve.  Heart size top-normal.  Pulmonary vascular congestion/ mild pulmonary edema most notable centrally.  IMPRESSION: Left-sided chest tube remains in place with the side hole outside the thoracic cage. Left apical pneumothorax similar to the prior exam (approximately 15%).  Femoral transvenous pacer tip projects over the heart and may be close to the tricuspid valve.  Pulmonary vascular congestion/ mild pulmonary edema most notable centrally.  These results were called by telephone at the time of interpretation on 08/30/2014 at 6:55 am to [facility] pts nurse acknowledged these results.   Electronically Signed   By: Bridgett Larsson M.D.   On: 08/30/2014 06:58   Dg Chest Port 1 View  09-04-14   CLINICAL DATA:  Endotracheal tube placement and orogastric tube placement. Respiratory distress. Initial encounter.  EXAM: PORTABLE CHEST - 1 VIEW  COMPARISON:  Chest radiograph performed earlier today at 2:16 p.m.  FINDINGS: The patient's endotracheal tube is seen ending 2-3 cm above the carina.  An enteric tube is noted extending below the diaphragm, with the side port likely at the distal esophagus. This could be advanced approximately 7 cm.  A right IJ line is noted ending about the mid SVC. A left-sided chest tube is unchanged in position, with the side port lateral to the ribs, and mild associated soft tissue air.  A persistent small left apical pneumothorax is noted.  Vascular congestion is seen, without significant pulmonary edema. No pleural effusion is seen.   The cardiomediastinal silhouette is borderline normal in size. No acute osseous abnormalities are identified.  IMPRESSION: 1. Endotracheal tube seen ending 2-3 cm above the carina. 2. Enteric tube noted extending below the diaphragm, with the side port likely at the distal esophagus. This could be advanced approximately 7 cm, as deemed clinically appropriate. 3. Left-sided chest tube again noted with the side port lateral to the ribs, with mild associated soft tissue air. Persistent small left apical pneumothorax, stable in appearance. 4. Vascular congestion, without significant pulmonary edema.   Electronically Signed   By: Roanna Raider M.D.   On: 2014/09/04 19:02   Dg Abd Portable 1v  September 04, 2014   CLINICAL DATA:  Feeding tube placement.  Initial encounter.  EXAM: PORTABLE ABDOMEN - 1 VIEW  COMPARISON:  Abdominal radiograph performed earlier today at 12:36 p.m.  FINDINGS: The patient's enteric tube is noted ending  overlying the body of the stomach, with the side port also at the body of the stomach.  The visualized bowel gas pattern is grossly unremarkable. No definite bowel dilatation is seen to suggest obstruction at this time. External pacing pads are noted. No acute osseous abnormalities are identified. The lung bases are difficult to fully assess on this study. A left-sided chest tube is again noted with the side-port just outside the left lateral ribs, with mild soft tissue air.  IMPRESSION: Enteric tube noted ending overlying the body of the stomach, with the side port also at the body of the stomach.   Electronically Signed   By: Roanna RaiderJeffery  Chang M.D.   On: 08/21/2014 20:17    CXR:  Int edema, ett wnl, line rt ij wnl, pTX 20% increased left  ASSESSMENT / PLAN:  PULMONARY A: Acute respiratory failure Pulmonary edema COPD with acute bronchospasm L pneumothorax - presumed due to chest compressions - increased , malplaced  P:   Peep reduction Requires a new CT with increasing size,  pressors pcxr post CT planned and in am  Keep current MV, repeat abg  CARDIOVASCULAR A:  NSTEMI Cardiac arrest Cardiogenic shock CHB post resuscitation- recovering Severe CAD Hyperlipidemia H/O hypertension Lower ext ischemia, from balloon pump? Thrombosis? P:  MAP goal 80 mmHg while hypothermic NE ordered to maintain MAP goal 80, may need to lower this goal if needs higher Vaso mainatin to reduce levo as able Cont amiodarone Cycle cardiac markers, noted peaked Echocardiogram ordered Cardiology on board, will discuss need to re float pacer Dopamine to max 8 mic  - beta affect Will call vascular for lower ext Stat cortisol then stress roids  TSH  RENAL A:   CKD (Cr 1.74 in ED @ [facility]) ARF, ATN hypoK Lactic acidosis P:   Continued pos balance q4h chem remain No free water as brain edema risk No role bicarb dc k supp Assess mag, phos  GASTROINTESTINAL A:   Ileus due to shock and hypothermia P:   SUP: IV famotidine Hold TFs until rewarmed lft for shock liver risk  HEMATOLOGIC A:   R/o lower EXt ischemia Svt prevention P:  DVT px: Full dose UFH for ACS - hold for Chest tube Monitor CBC intermittently Transfuse per usual ICU guidelines vasc consult likely  INFECTIOUS A:   Concern sepsis, Asp?, CAP? P:   PCT noted, repeat Add empiric ceftriaxone, vanc, levo Echo for veg Pct follow up BC Sputum Urine strep, leg  ENDOCRINE A:   Hyperglycemia without prior dx of DM Risk of dysglycemia while hypothermic R/o rel AI P:   Sens scale SSI Cortisol Stress roids,  tsh  NEUROLOGIC A:   Anoxic encephalopathy P:   RASS goal: N/A while on NMBs Hypothermia protocol continued Midaz and fent per protocol rewarm at 24 hr slowly EEG No WUA  FAMILY  - Updates: will call  - Inter-disciplinary family meet or Palliative Care meeting due by:  02/01  Ccm time 45 min   TODAY'S SUMMARY: needs another Chest tube, will place, hold heparin, treat CAP for  now, drop peep for now, culture, ABG, echo, re float pacer?  Mcarthur Rossettianiel J. Tyson AliasFeinstein, MD, FACP Pgr: [phone] [facility]

## 2014-08-30 NOTE — Consult Note (Signed)
[facility] [place] HISTORY AND PHYSICAL  Reason for consult: Right leg ischemia Requesting: Micah Flesheran Feinstein, MD, critical care History of Present Illness:  Patient is a 59 y.o. year old male who presents for evaluation of ischemic right leg.  Pt transferred from [facility] with cardiogenic shock acute MI and multivessel coronary disease.  First noted to have right leg ischemia according to his chart at 10 am today.  I was called to see patient at 1215 today as pt has been unstable and critically ill.  He is currently on multiple pressors Levophed/Vasopressin and a heparin drip as well as hypothermia protocol.  Pt had cardiac cath via right femoral approach at [place] yesterday.  Review of the cath note is fairly unremarkable regarding the access.  An abdominal aortogram and right iliac angio was performed according to notes but no real details given regarding findings or indications other than consideration for closure device which apparently was not used.  Hemostasis manual compression uneventful according to notes with "pulses" post procedure.  Pt also had right femoral venous sheath placed for pacer and left femoral aline placed here.  Other medical problems include left pneumothorax from CPR, multiple Vfib arrests, VDRF, hypertension, hyperlipid, tobacco abuse, renal dysfunction, hyperglycemia all currently being managed by critical care service.  Pt is also on empiric antibiotics for possible sepsis.  Social History History  Substance Use Topics  . Smoking status: Not on file  . Smokeless tobacco: Not on file  . Alcohol Use: Not on file    Family History No family history on file.  Allergies  No Known Allergies   Current Facility-Administered Medications  Medication Dose Route Frequency Provider Last Rate Last Dose  . 0.9 %  sodium chloride infusion  2,000 mL Intravenous Once Merwyn Katosavid B Simonds, MD   2,000 mL at 08/19/2014 1845  . 0.9 %  sodium chloride infusion    Intravenous Continuous Merwyn Katosavid B Simonds, MD 10 mL/hr at 08/30/14 1200    . amiodarone (NEXTERONE PREMIX) 360 MG/200ML (1.8 mg/mL) IV infusion  30 mg/hr Intravenous Continuous Merwyn Katosavid B Simonds, MD 16.7 mL/hr at 08/30/14 1200 30 mg/hr at 08/30/14 1200  . antiseptic oral rinse (CPC / CETYLPYRIDINIUM CHLORIDE 0.05%) solution 7 mL  7 mL Mouth Rinse QID Nelda Bucksaniel J Feinstein, MD   7 mL at 08/30/14 1205  . artificial tears (LACRILUBE) ophthalmic ointment 1 application  1 application Both Eyes 3 times per day Merwyn Katosavid B Simonds, MD   1 application at 08/30/14 0604  . aspirin suppository 300 mg  300 mg Rectal Daily Merwyn Katosavid B Simonds, MD      . budesonide (PULMICORT) nebulizer solution 0.25 mg  0.25 mg Nebulization Q6H Nelda Bucksaniel J Feinstein, MD   0.25 mg at 08/30/14 0806  . cefTRIAXone (ROCEPHIN) 1 g in dextrose 5 % 50 mL IVPB - Premix  1 g Intravenous Q24H Benny LennertAndrew David Meyer, [facility]      . chlorhexidine (PERIDEX) 0.12 % solution 15 mL  15 mL Mouth Rinse BID Nelda Bucksaniel J Feinstein, MD   15 mL at 08/30/14 0813  . cisatracurium (NIMBEX) 200 mg in sodium chloride 0.9 % 200 mL (1 mg/mL) infusion  1-1.5 mcg/kg/min Intravenous Continuous Judie BonusKimberly Ballard Hammons, RPH 5.5 mL/hr at 08/30/14 1200 1.004 mcg/kg/min at 08/30/14 1200   And  . cisatracurium (NIMBEX) bolus via infusion 4.6 mg  0.05 mg/kg Intravenous PRN Judie BonusKimberly Ballard Hammons, RPH      . DOPamine (INTROPIN) 800 mg in dextrose 5 % 250 mL (3.2  mg/mL) infusion  5-8 mcg/kg/min Intravenous Titrated Nelda Bucks, MD 8.6 mL/hr at 08/30/14 1200 5.024 mcg/kg/min at 08/30/14 1200  . famotidine (PEPCID) IVPB 20 mg  20 mg Intravenous Q12H Merwyn Katos, MD   20 mg at September 19, 2014 2156  . fentaNYL (SUBLIMAZE) 2,500 mcg in sodium chloride 0.9 % 250 mL (10 mcg/mL) infusion  25-400 mcg/hr Intravenous Continuous Merwyn Katos, MD 10 mL/hr at 08/30/14 1200 100 mcg/hr at 08/30/14 1200  . fentaNYL (SUBLIMAZE) bolus via infusion 50 mcg  50 mcg Intravenous Q30 min PRN Merwyn Katos, MD       . heparin ADULT infusion 100 units/mL (25000 units/250 mL)  1,200 Units/hr Intravenous Continuous Nelda Bucks, MD 12 mL/hr at 08/30/14 1203 1,200 Units/hr at 08/30/14 1203  . hydrocortisone sodium succinate (SOLU-CORTEF) 100 MG injection 50 mg  50 mg Intravenous Q6H Baltazar Apo, MD      . insulin regular (NOVOLIN R,HUMULIN R) 250 Units in sodium chloride 0.9 % 250 mL (1 Units/mL) infusion   Intravenous Continuous Nelda Bucks, MD 3.4 mL/hr at 08/30/14 1200    . ipratropium-albuterol (DUONEB) 0.5-2.5 (3) MG/3ML nebulizer solution 3 mL  3 mL Nebulization Q6H Merwyn Katos, MD   3 mL at 08/30/14 0806  . levofloxacin (LEVAQUIN) IVPB 750 mg  750 mg Intravenous Q48H Benny Lennert, [facility]      . midazolam (VERSED) 50 mg in sodium chloride 0.9 % 50 mL (1 mg/mL) infusion  1-10 mg/hr Intravenous Continuous Merwyn Katos, MD 2 mL/hr at 08/30/14 1200 2 mg/hr at 08/30/14 1200  . midazolam (VERSED) bolus via infusion 2 mg  2 mg Intravenous Q30 min PRN Merwyn Katos, MD      . midazolam (VERSED) injection 2 mg  2 mg Intravenous Once Merwyn Katos, MD      . norepinephrine (LEVOPHED) 16 mg in dextrose 5 % 250 mL (0.064 mg/mL) infusion  0-50 mcg/min Intravenous Titrated Merwyn Katos, MD 42.2 mL/hr at 08/30/14 1208 45 mcg/min at 08/30/14 1208  . [START ON 08/29/2014] vancomycin (VANCOCIN) 1,250 mg in sodium chloride 0.9 % 250 mL IVPB  1,250 mg Intravenous Q24H Benny Lennert, [facility]      . vancomycin (VANCOCIN) 1,750 mg in sodium chloride 0.9 % 500 mL IVPB  1,750 mg Intravenous Once Benny Lennert, [facility]      . vasopressin (PITRESSIN) 40 Units in sodium chloride 0.9 % 250 mL (0.16 Units/mL) infusion  0.03 Units/min Intravenous Continuous Merwyn Katos, MD 11.3 mL/hr at 08/30/14 1200 0.03 Units/min at 08/30/14 1200    ROS:   Unable to obtain pt on ventilator sedated  Physical Examination  Filed Vitals:   08/30/14 0900 08/30/14 1000 08/30/14 1100 08/30/14 1200  BP: 100/67 83/66  101/66 98/72  Pulse:    30  Temp: 90.9 F (32.7 C) 91.2 F (32.9 C) 91.2 F (32.9 C) 91.9 F (33.3 C)  TempSrc: Core (Comment) Core (Comment) Core (Comment) Core (Comment)  Resp: Height:      Weight:      SpO2: 92% 91% 92% 90%    Body mass index is 29.71 kg/(m^2).  General:  Sedated unresponsive HEENT: some periorbital edema, ET tube in place Pulmonary: coarse breath sounds bilaterally Cardiac: Regular Rate and Rhythm  Abdomen: Soft,  non-distended hypothermia dressing in place Skin: No rash, venous congested appearance right upper thigh and calf Extremity Pulses:  doppler radial bilaterally, 1+ left femoral pulse with  good waveform aline,absent dorsalis pedis, posterior tibial doppler signals, right foot slightly more pale then left which is dusky, right femoral venous sheath Musculoskeletal: No deformity trace edema  Neurologic: sedated unresponsive  DATA:   CBC    Component Value Date/Time   WBC 16.0* 08/30/2014 0405   RBC 4.84 08/30/2014 0405   HGB 14.7 08/30/2014 0405   HCT 44.2 08/30/2014 0405   PLT 133* 08/30/2014 0405   MCV 91.3 08/30/2014 0405   MCH 30.4 08/30/2014 0405   MCHC 33.3 08/30/2014 0405   RDW 13.6 08/30/2014 0405   LYMPHSABS 1.3 08/30/2014 0113   MONOABS 0.7 08/30/2014 0113   EOSABS 0.0 08/30/2014 0113   BASOSABS 0.0 08/30/2014 0113   BMET    Component Value Date/Time   NA 145 08/30/2014 0825   K 2.9* 08/30/2014 0825   CL 109 08/30/2014 0825   CO2 20 08/30/2014 0825   GLUCOSE 261* 08/30/2014 0825   BUN 23 08/30/2014 0825   CREATININE 1.90* 08/30/2014 0825   CALCIUM 8.6 08/30/2014 0825   GFRNONAA 37* 08/30/2014 0825   GFRAA 43* 08/30/2014 0825    Cardiac cath images reviewed: aortogram is of poor quality with motion artifact renal arteries grossly patent, infrarenal aorta patent, right and left common iliacs patent with increased flow down left compared to right although the right is visualized.  A retrograde shot of the  right external iliac was also performed through the sheath which shows a patent right external iliac and profunda no visualization of SFA   ASSESSMENT:  Ischemia both feet right greater than left.  Some of this is most likely pressor effect but certainly may have acute injury of right common femoral artery from cath, right SFA occlusion potentially acute but since pt had perfusion to right foot documented post cath this is most likely chronic.  Pt is critically ill and currently not a candidate for operation.  This was discussed with Dr Tyson Alias who agrees.   PLAN:  1.  Will obtain arterial duplex today to try to decipher anatomy of legs and look for acute thrombus. 2.  Continue heparin drip as this may benefit his legs as well  3.  Wean pressors if tolerated  4.  If found to have acute thrombus/embolus right femoral artery will consider exploring his right groin for possible embolectomy/thrombectomy if pt becomes more stable.  Pt currently has 4hours of documented ischemia and likelihood of limb salvage is low after 6 hours of ischemia.  If patient remains critically ill with chance for survival with ischemic non salvageable leg would consider medical amputation with freezing leg but I don't think we are there yet.  Would trend CK and myoglobin and maintain urinary flow +/- alkalanize urine to protect kidneys.  Fabienne Bruns, MD [facility] [place] Office: [phone] Pager: [phone]

## 2014-08-30 NOTE — Progress Notes (Signed)
*  PRELIMINARY RESULTS* Vascular Ultrasound Bilateral Lower Extremity Arterial Duplex has been completed.   Study was technically limited due to a right venous line in the right groin and left arterial line in the left groin. Unable to visualize flow in bilateral lower extremity arteries.   Bilateral lower extremity venous duplex completed. Bilateral lower extremities are positive for deep vein thrombosis involving bilateral posterior tibial and peroneal veins. The right posterior tibial vein thrombus appears to be mobile. There is no evidence of Baker's cyst bilaterally.  Preliminary results discussed with Dr.Fields.  08/30/2014  Gertie FeyMichelle Jadalyn Oliveri, RVT, RDCS, RDMS

## 2014-08-30 NOTE — Progress Notes (Signed)
CRITICAL VALUE ALERT  Critical value received:  Lactic: 11.5  Date of notification:  08/30/2014  Time of notification:  2310  Critical value read back:Yes.    Nurse who received alert:  Deniece ReeMary Arick Mareno  MD notified (1st page):  Pola CornElink  Responding MD:  Pola CornElink  Time MD responded: 2310

## 2014-08-30 NOTE — Care Management Note (Signed)
    Page 1 of 1   08/30/2014     10:11:25 AM CARE MANAGEMENT NOTE 08/30/2014  Patient:  Dustin Chavez,Dustin Chavez   Account Number:  [id number]  Date Initiated:  08/30/2014  Documentation initiated by:  Junius CreamerWELL,DEBBIE  Subjective/Objective Assessment:   adm w cardiac arrest-vent     Action/Plan:   lives w fam   Anticipated DC Date:     Anticipated DC Plan:           Choice offered to / List presented to:             Status of service:   Medicare Important Message given?   (If response is "NO", the following Medicare IM given date fields will be blank) Date Medicare IM given:   Medicare IM given by:   Date Additional Medicare IM given:   Additional Medicare IM given by:    Discharge Disposition:    Per UR Regulation:  Reviewed for med. necessity/level of care/duration of stay  If discussed at Long Length of Stay Meetings, dates discussed:    Comments:

## 2014-08-30 NOTE — Consult Note (Signed)
Duplex findings reviewed.  Bilateral SFA occlusions most likely chronic.  Right common femoral artery occluded unable to visualize right distal external iliac artery.  Clinically this is acute.  Left foot ischemia most likely secondary to combined chronic PAD with pressor effect. Bilateral calf DVTs acute.    Pt critically ill currently not candidate for operation with declining respiratory status now.  Pt is extremely high risk for right limb loss and potentially left limb loss.  Will continue to follow but not much to offer currently other than heparin unless overall clinical picture improves  Findings discussed with Dr Tyson AliasFeinstein.  Fabienne Brunsharles Fields, MD [facility] [place] Office: [phone] Pager: [phone]

## 2014-08-30 NOTE — Progress Notes (Signed)
CRITICAL VALUE ALERT  Critical value received:  Troponin: 37.78  Date of notification:  08/30/2014  Time of notification:  2015  Critical value read back:Yes.    Nurse who received alert:  Deniece ReeMary Addasyn Mcbreen  MD notified (1st page):  Pola CornElink  Responding MD:  Pola CornElink  Time MD responded: 2015

## 2014-08-31 LAB — POCT I-STAT 3, ART BLOOD GAS (G3+)
ACID-BASE DEFICIT: 24 mmol/L — AB (ref 0.0–2.0)
Bicarbonate: 5 mEq/L — ABNORMAL LOW (ref 20.0–24.0)
O2 Saturation: 100 %
PCO2 ART: 15.5 mmHg — AB (ref 35.0–45.0)
TCO2: 6 mmol/L (ref 0–100)
pH, Arterial: 7.086 — CL (ref 7.350–7.450)
pO2, Arterial: 311 mmHg — ABNORMAL HIGH (ref 80.0–100.0)

## 2014-08-31 LAB — POCT I-STAT, CHEM 8
BUN: 27 mg/dL — ABNORMAL HIGH (ref 6–23)
CREATININE: 1.3 mg/dL (ref 0.50–1.35)
Calcium, Ion: 1.11 mmol/L — ABNORMAL LOW (ref 1.12–1.23)
Chloride: 119 mmol/L — ABNORMAL HIGH (ref 96–112)
Glucose, Bld: 96 mg/dL (ref 70–99)
HEMATOCRIT: 30 % — AB (ref 39.0–52.0)
Hemoglobin: 10.2 g/dL — ABNORMAL LOW (ref 13.0–17.0)
Potassium: 5.9 mmol/L — ABNORMAL HIGH (ref 3.5–5.1)
Sodium: 143 mmol/L (ref 135–145)
TCO2: 6 mmol/L (ref 0–100)

## 2014-08-31 LAB — GLUCOSE, CAPILLARY
GLUCOSE-CAPILLARY: 98 mg/dL (ref 70–99)
GLUCOSE-CAPILLARY: 98 mg/dL (ref 70–99)
Glucose-Capillary: 123 mg/dL — ABNORMAL HIGH (ref 70–99)
Glucose-Capillary: 151 mg/dL — ABNORMAL HIGH (ref 70–99)
Glucose-Capillary: 193 mg/dL — ABNORMAL HIGH (ref 70–99)
Glucose-Capillary: 195 mg/dL — ABNORMAL HIGH (ref 70–99)
Glucose-Capillary: 213 mg/dL — ABNORMAL HIGH (ref 70–99)
Glucose-Capillary: 224 mg/dL — ABNORMAL HIGH (ref 70–99)
Glucose-Capillary: 79 mg/dL (ref 70–99)
Glucose-Capillary: 89 mg/dL (ref 70–99)

## 2014-08-31 LAB — LEGIONELLA ANTIGEN, URINE

## 2014-08-31 LAB — LACTIC ACID, PLASMA: Lactic Acid, Venous: >11 mmol/L (ref 0.5–2.0)

## 2014-08-31 LAB — URINE CULTURE

## 2014-08-31 MED ORDER — SODIUM BICARBONATE 8.4 % IV SOLN: 50.0000 meq | Freq: Once | INTRAVENOUS | Status: DC

## 2014-08-31 MED ORDER — SODIUM CHLORIDE 0.9 % IV SOLN
1.0000 g | Freq: Once | INTRAVENOUS | Status: AC
  Administered 2014-08-31: 1 g via INTRAVENOUS
  Filled 2014-08-31: qty 10

## 2014-08-31 MED ORDER — SODIUM BICARBONATE 8.4 % IV SOLN
INTRAVENOUS | Status: AC
  Filled 2014-08-31: qty 50

## 2014-09-01 LAB — CULTURE, RESPIRATORY W GRAM STAIN

## 2014-09-01 LAB — CULTURE, RESPIRATORY

## 2014-09-05 NOTE — Progress Notes (Addendum)
Chaplain paged to visit [name]'s family as he is actively dying.   Pt's wife, brother-in-law and his wife are present bedside.   Pt and wife have been together 40 years and have 3 children and 6 grandchildren. Expressed that they "received a call that we better get up here".   After some conversation, family requested prayer for peace with what's going on.   Chaplain asked if there is any one else coming to support them this morning, wife noted that "We didn't call anybody." Noted that they have some family out of state and will take it one step at a time.   Doctor now bedside.   Following as needed.   Gala RomneyBrown, Josi Roediger J, Chaplain 09-28-2014

## 2014-09-05 NOTE — Progress Notes (Signed)
CRITICAL VALUE ALERT  Critical value received:  PH: 7.086, pCO2: 15.5, CO2: 5   Date of notification:  08/30/14  Time of notification:  1155  Critical value read back:Yes.    Nurse who received alert:  Deniece ReeMary Yoniel Arkwright  MD notified:  Dr. Craige CottaSood (elink)  Time MD notified:  [phone]

## 2014-09-05 NOTE — Progress Notes (Signed)
PCCM Interval Progress Note  Asked by Pola CornELINK MD to come to bedside and meet with pt's family.  Family called by RN earlier and informed that pt was clinically deteriorating.  Pt's wife now at bedside emotional as she realizes that her husband is actively dying.  She stated that she did not want to prolong the process and place any more suffering on her husband.  She feels that he has gone through enough and that it is not fair to let him continue this way.   She has requested that we proceed with full comfort care and that we withdraw on all life sustaining measures.  I have asked RN to bolus with Fentanyl before turning off all pressors, drips, etc.  Pt passed within 1 - 2 minutes after pressors turned off, time of death 0251.  Emotional support offered to family.   [name], [place] - [facility] Pgr: [phone]  or [phone] 2015/03/11, 2:53 AM

## 2014-09-05 NOTE — Progress Notes (Signed)
Pt time of death 630251. Listened for 2 minutes with [place] Hodgin RN. No heart sounds noted. Asystole and absent arterial blood pressure noted on monitor. Family present at bedside. CDS notified. Chaplin to bedside. Attending notified. Pt transported to morgue.

## 2014-09-05 NOTE — Progress Notes (Signed)
CRITICAL VALUE ALERT  Critical value received:  Lactic: >11  Date of notification: 08/27/2014  Time of notification:  0120  Critical value read back:Yes.    Nurse who received alert:  Deniece ReeMary Briscoe Daniello  MD notified: Dr. Craige CottaSood  Time MD notified: [phone]

## 2014-09-05 DEATH — deceased

## 2014-09-06 NOTE — Discharge Summary (Addendum)
Dustin Chavez, MINCEY NO.:  [id number]  MEDICAL RECORD NO.:  [id number]  LOCATION:  2H13C                        FACILITY:  [facility]  PHYSICIAN:  Nelda Bucks, MD DATE OF BIRTH:  1956-07-31  DATE OF ADMISSION:  08/20/2014 DATE OF DISCHARGE:  09-12-14                              DISCHARGE SUMMARY   This is a 59 year old, Caucasian male with multiple coronary obstructions with respiratory failure who was admitted initially from [facility] in emergency room, who suffered a VT, VFib arrest in where he underwent cardiac catheterization there at [facility], was demonstrated severe three-vessel disease.  He was intubated, underwent hypothermic protocol stabilization and also underwent attempt at the balloon pump insertion which then was discontinued secondary to cardiogenic shock.  I suspect there is a documented 15 minutes of CPR in the cath lab prior to left heart catheterization.  HOSPITALIZATION HISTORY:  Described above with the cardiac arrest, cardiac catheterization, balloon pump placement and removal, also noted to have a left-sided pneumothorax suffering during CP over the left chest tube placement at [place].  He had an occluded RCA noted, left circumflex 80% in mid LAD, left ventricular ejection fraction of 40% on cardiac catheterization on August 29, 2014.  He was transferred to [facility] unit where hypothermia protocol was initiated. Cardiology consultation, echocardiogram, and EEG were all ordered.  The patient had ET tube.  Right IJ CVL left-sided chest tube was then migrated down and found to be out of the chest on August 30, 2014, with increasing pneumothorax for which a 2nd new chest tube was placed and an old chest tube which was essentially out of the chest was removed.  With his new chest tube, there was full resolution of the pneumothorax.  He also has a left femoral arterial catheter on August 29, 2014, and  a temporary pacer wire that was now placed upon admission to [facility] and then pulled eventually by our staff because the patient was out of complete heart block, was not being utilized.  HOSPITAL COURSE:  Found then to have a dead right extremity with no pulses from femoral downward for which Vascular Surgery was consulted. The patient was doing very poorly with multiorgan failure, recurrent shock.  Dopamine was being required.  Stress steroids were provided for concerns of adrenal insufficiency.  He had lactic acidosis.  He had chronic renal failure, acute on chronic renal failure.  He had an ileus most likely secondary to the shock state.  He was intermittently on heparin and continued for his cardiac status as well as his ischemic lower limb.  I had discussions with his wife which she was clearly understood his likely poor prognosis and his wishes were not to be have aggressive measures provided if his heart were stopped again and a DNR was established.  He also was being treated for community acquired pneumonia and aspiration with an elevated procalcitonin and bilateral infiltrates.  The patient had multiorgan failure.  He would have to undergo amputation to survive this hospitalization secondary to the ischemic limb eventually.  He continued to decline with worsening hypoxemia.  He also was found to have a DVT and presumed  pulmonary embolism for which he was being provided heparin.  He was not a candidate for tPA secondary to his prolonged CPR at higher risk for pericardial tamponade from hemorrhage.  The patient had our nurse practitioner called to the bedside and the eLink physician secondary to refractory hypoxemia and shock.  Wife was at the bedside and opted for comfort care.  The patient had comfort care, medication provided with pressors, discontinued within 1-2 minutes, expired.  FINAL DIAGNOSES UPON DEATH: 1. Massive myocardial infarction with 3-vessel disease. 2.  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 3. Acute respiratory failure. 4. CPR induced pneumothorax status post chest tube placement. 5. Acute on chronic renal failure. 6. Pneumonia. 7. Cardiogenic shock. 8. Right lower extremity ischemia. 9. Deep venous thrombosis. 10.Rule out pulmonary embolism. 11.Acute systolic CHF                          Nelda Bucksaniel J Jamar Weatherall, MD     DJF/MEDQ  D:  09/05/2014  T:  09/06/2014  Job:  [id number]

## 2014-12-04 NOTE — Consult Note (Signed)
Patient hemodynamically ok, but 100 % pacing. He is going to [place] soon.  Electronic Signatures: Radene KneeKhan, Jerni Selmer Ali (MD)  (Signed on 25-Jan-16 16:00)  Authored  Last Updated: 25-Jan-16 16:00 by Radene KneeKhan, Asheley Hellberg Ali (MD)

## 2014-12-04 NOTE — Consult Note (Signed)
PATIENT NAME:  Dustin Chavez, Dustin M MR#:  [id number] DATE OF BIRTH:  [birth year]  DATE OF CONSULTATION:  08/30/2014  INDICATION FOR CONSULTATION: Cardiac arrest, respiratory failure, and requiring external pacemaker.   HISTORY OF PRESENT ILLNESS: This is a 59 year old white male, who apparently called EMS, or the family called the EMS. He was feeling unwell. When the EMS got there, he was in respiratory distress and was intubated and was found to be in ventricular tachycardia. He was transferred to the emergency room, where he was shocked several times and then externally, he started getting paced, because he was bradycardic. He was started on pressors. I was asked to evaluate the patient for temporary pacemaker implantation and possible cardiac catheterization and possible intervention. No past medical history is available. Apparently, has a history of hypertension.   SOCIAL HISTORY: Unremarkable. Not available.   FAMILY HISTORY: Unavailable.   PHYSICAL EXAMINATION:  GENERAL: He is alert and oriented x0, intubated. He is being externally paced at 70 beats per minute. Blood pressure is ranging between 72 and 90. He is on pressors.  HEENT: Reveals that he is intubated. Positive JVD.  LUNGS: There are crackles at the bases.  HEART: Irregularly irregular. Normal S1, S2. No audible murmur.  ABDOMEN: Soft, nontender, positive bowel sounds.  EXTREMITIES: No pedal edema.  NEUROLOGIC: The patient is intubated and unresponsive.   LABORATORY: His labs showed troponin of 2.2. BNP of 6791, BUN is 13, creatinine is 1.74, CPK was 404. His EKG shows complete AV block with nonspecific interventricular conduction delay, old inferior wall myocardial infarction, nonspecific ST-T changes. Heart rate 44. His INR is 1.4, PT is 17.2. Chest x-ray shows endotracheal tube in place, congestive heart failure changes with interstitial edema.   ASSESSMENT AND PLAN: The patient has respiratory failure, non-ST segment elevation  myocardial infarction, complete heart block, congestive heart failure. I advise taking the patient to the catheter lab, putting temporary pacemaker in. I advise doing left heart catheterization and possible intervention after  that. In the meantime, increase the pressors to keep blood pressure systolic 90. Continue external pacing. We will make further recommendation after the procedure.   Thank you very much for the referral.  ____________________________ Laurier NancyShaukat A. Karyna Bessler, MD sak:ap D: 08/25/2014 09:51:46 ET T: 08/20/2014 10:18:56 ET JOB#: [id number]  cc: Laurier NancyShaukat A. Kiyoshi Schaab, MD, <Dictator> Laurier NancySHAUKAT A Daysean Tinkham MD ELECTRONICALLY SIGNED 09/03/2014 11:44

## 2014-12-04 NOTE — H&P (Signed)
PATIENT NAME:  Dustin Chavez, Dustin Chavez MR#:  [id number] DATE OF BIRTH:  03/02/1956  DATE OF ADMISSION:  08/06/2014  PRIMARY CARE PHYSICIAN:  Nonlocal.    REFERRING PHYSICIAN:  Dr. Manson Passey.    CHIEF COMPLAINT:  Respiratory failure and cardiac arrest.   HOSPITAL COURSE: The patient is a 59 year old Caucasian male with a history of hypertension and hyperlipidemia, who was sent to ED by EMS due to acute respiratory failure and cardiac arrest. The patient had SOB and respiratory distress at home early this morning and then was noted to have cardiac arrest by EMS later.   CPR was performed. The patient was found to have a ventricular tachycardia, was treated with epinephrine 3 times and shock 3 times. In addition the patient was started with amiodarone drip. The patient was intubated. The patient's troponin was elevated at 4.2. Dr. Welton Flakes suggested the patient has MI and  ventricular tachycardia,  needs a cardiac catheterization and pacemaker. The patient was coded again during catheterization. The patient got a temporary pacemaker. According to Dr. Welton Flakes the patient had 3 vessel stenosis with an ejection fraction of 40%.   PAST MEDICAL HISTORY: Hypertension, hyperlipidemia, COPD, chronic back pain, sleep apnea, CVA, seizure disorder.   PAST SURGICAL HISTORY: Appendectomy, back surgery.    REVIEW OF SYSTEMS:  Unable to obtain at this time.   FAMILY HISTORY: Unable to obtain.     PHYSICAL EXAMINATION:  VITAL SIGNS: Blood pressure 81/37, pulse 122, O2 saturation 83% on ventilator.  The patient now is on dopamine drip and amiodarone drip.   HEENT: Pupils are round, dilated, not reactive to light. The patient is intubation status.  NECK:  Supple.  No JVD or carotid bruits. No lymphadenopathy.  CARDIOVASCULAR: S1 and S2. Regular rate, tachycardia. No murmurs or gallops.  PULMONARY:  Bilateral air entry.  No wheezing, but has very weak breath sounds with crackles.  ABDOMEN: Obese. Bowel sounds are hypoactive. No  obvious organomegaly. No distention.  EXTREMITIES: Bilateral lower extremity edema. No clubbing or cyanosis. Weak bilateral pedal pulses.  NEUROLOGY: The patient is in intubation status, unable to examine at this time.   LABORATORY DATA: CK 404, CK-MB 16.1. WBC 13.4, hemoglobin 14, platelets are 155,000. Glucose 220, BUN 13, creatinine 1.74, sodium 150, potassium 3.0, chloride 109, bicarbonate 17. INR 1.4. Troponin 2.20. Urinalysis showed WBC 63, RBC 199, nitrates negative. BNP 6791.   IMPRESSIONS:  1. Cardiac arrest.  2. Acute respiratory failure.  3. Non-ST segment elevation myocardial infarction.  4. Acute congestive heart failure.   5. Acute renal failure.  6. Hypernatremia.  7. Hypokalemia.  8. Urinary tract infection with leukocytosis,  9. Hypotension.  PLAN OF TREATMENT:  1.  The patient was diagnosed with non-STEMI.  Got a cardiac catheterization by Dr. Welton Flakes. According to Dr. Welton Flakes,  the patient has 3 vessel stenosis, occluded mid  LCx, mid RCA, and 80% mid LAD with an ejection fraction of 40%. Dr. Welton Flakes advised CABG emergently and arrange transfer to [facility].   2.  Change to levophed drip and continue amiodarone drip. Continue ventilation management. Pulmonary consult. 3.  We will give potassium supplement and follow up BMP.  4.  For UTI,  start Rocephin and follow up the urine culture.   5.  I discussed the patient's critical condition with Dr. Welton Flakes, Dr. Belia Heman.  Dr. Belia Heman will place central line. The patient is waiting for transfer to [facility] for CABG.   7.  The patient is a full code.  TIME SPENT: About 68 minutes.    ____________________________ Shaune PollackQing Dajiah Kooi, MD qc:bu D: September 12, 2014 12:29:41 ET T: September 12, 2014 13:02:50 ET JOB#: [id number]  cc: Shaune PollackQing Artemis Loyal, MD, <Dictator> Shaune PollackQING Marriana Hibberd MD ELECTRONICALLY SIGNED 10-08-2014 14:59

## 2014-12-04 NOTE — Consult Note (Signed)
I got more history from patients wife that he had severe shortness of breath all night and has h/o 3 ppd smoking, and HTN, hyperlipedemia, and  CVA in past. He coded 3 times in ER and twice in cath lab. I spent from 9 am to 12 noon.  Electronic Signatures: Radene KneeKhan, Dabid Godown Ali (MD)  (Signed on 25-Jan-16 11:37)  Authored  Last Updated: 25-Jan-16 11:37 by Radene KneeKhan, Shatiqua Heroux Ali (MD)

## 2014-12-04 NOTE — Op Note (Signed)
PATIENT NAME:  Dustin Chavez, Dustin Chavez MR#:  [id number] DATE OF BIRTH:  [birth year]  DATE OF PROCEDURE:  08/15/2014  SURGEON: [name], MD   ASSISTANT: None.   PREOPERATIVE DIAGNOSIS: Tension pneumothorax, left side.   POSTOPERATIVE DIAGNOSIS: Tension pneumothorax, left side.   OPERATION PERFORMED: Insertion of left-sided 32 French chest tube.   INDICATIONS FOR PROCEDURE: [name] is a 59 year old gentleman who had a cardiac arrest and had multiple rounds of CPR. During his resuscitation, he had a chest x-ray made, which showed a tension pneumothorax. I was called to the patient's bedside where I examined the x-ray showing a tension pneumothorax on the left. I have discussed his care with the patient's wife and an informed consent was obtained. The patient was unable to give consent because he was intubated.   DESCRIPTION OF PROCEDURE: With the patient lying with the left chest slightly upwards, he was prepped and draped in the usual sterile fashion. I elected to make a skin incision just below the nipple line. I deepened this down through the muscles of the subcutaneous tissues until the pleural space was entered. Upon entering the pleural space, we did have a gush of air. I then placed my finger in the pleural space and the lung was adherent to the inside of the ribs. Because of the patient's very large size, it was difficult to sweep the lung away. I tried to place the chest tube. It would not advance far into the pleural space. I tried to put a smaller tube in, but it too would not advance. Therefore, I tried to sweep more of the lung away from the chest wall and enlarge my incision slightly so that I could try to advance my finger. However, the patient is quite large and it was difficult to get a free pleural space. Therefore, I elected to place the tube as far as I could, and I secured this to the skin with two 0 silks on either side. Sterile dressings were applied, and the chest x-ray was  obtained.   The chest x-ray showed that the tube was in the pleural space, although the first hole was outside the ribs. However, the lung was expanded and there was a small air leak present on the Pleur-evac; therefore, I elected to leave this alone as I did not think I could improve upon it. The patient tolerated the procedure well.   ____________________________ Sheppard Plumberimothy E. Thelma Bargeaks, MD teo:MT D: 08/14/2014 16:41:24 ET T: 08/30/2014 17:33:09 ET JOB#: [id number]  cc: Marcial Pacasimothy E. Thelma Bargeaks, MD, <Dictator> Jasmine DecemberIMOTHY E Ysabela Keisler MD ELECTRONICALLY SIGNED 09/19/2014 9:55

## 2014-12-04 NOTE — Discharge Summary (Signed)
PATIENT NAME:  Dustin Chavez, Dustin Chavez MR#:  [id number] DATE OF BIRTH:  Mar 10, 1956  DATE OF ADMISSION:  2014/09/29 DATE OF DISCHARGE: 2014/09/29    Transfer and discharge summary; please refer to the H and P dictated by me just now.  I discussed with Dr. Welton FlakesKhan, cardiologist.  He said Dr. Lavinia SharpsBartel cardiologist [facility]   The [facility] be [facility].   CURRENT DIAGNOSES:  1.  Cardiac arrest, ventricular tach, non ST segment myocardial infarction. 2.  Acute congestive heart failure with acute respiratory failure. 3.  Acute renal failure. 4.  Hypernatremia. 5.  Hypokalemia. 6.  Hyperlipidemia. 7.  Coronary artery disease with a 3 vessel disease.   8.  Urinary tract infection with leukocytosis.   9. Hypotension 10. Left side pneumonthorax. Status post chest tube placement.  CURRENT MEDICATIONS:   1.  Amiodarone.  2.  Rocephin 1 gram IVPB.  3. Levophed drip.  4. Duoneb 5. pulmicort. 6. Vasopressin drip.  TIME SPENT: About 36 minutes.    ____________________________ Shaune PollackQing Milind Raether, MD qc:DT D: 2014/09/29 12:33:00 ET T: 2014/09/29 13:14:07 ET JOB#: [id number]  cc: Shaune PollackQing Azuree Minish, MD, <Dictator> Shaune PollackQING Harvest Deist MD ELECTRONICALLY SIGNED 05-01-2015 15:04

## 2016-05-26 IMAGING — CR DG ABDOMEN 1V
1 series · 1 of 1 positions shown · non-contrast
Comparison: None.

CLINICAL DATA: Orogastric tube placement.  Initial encounter.

EXAM:
ABDOMEN - 1 VIEW

[supine kub]
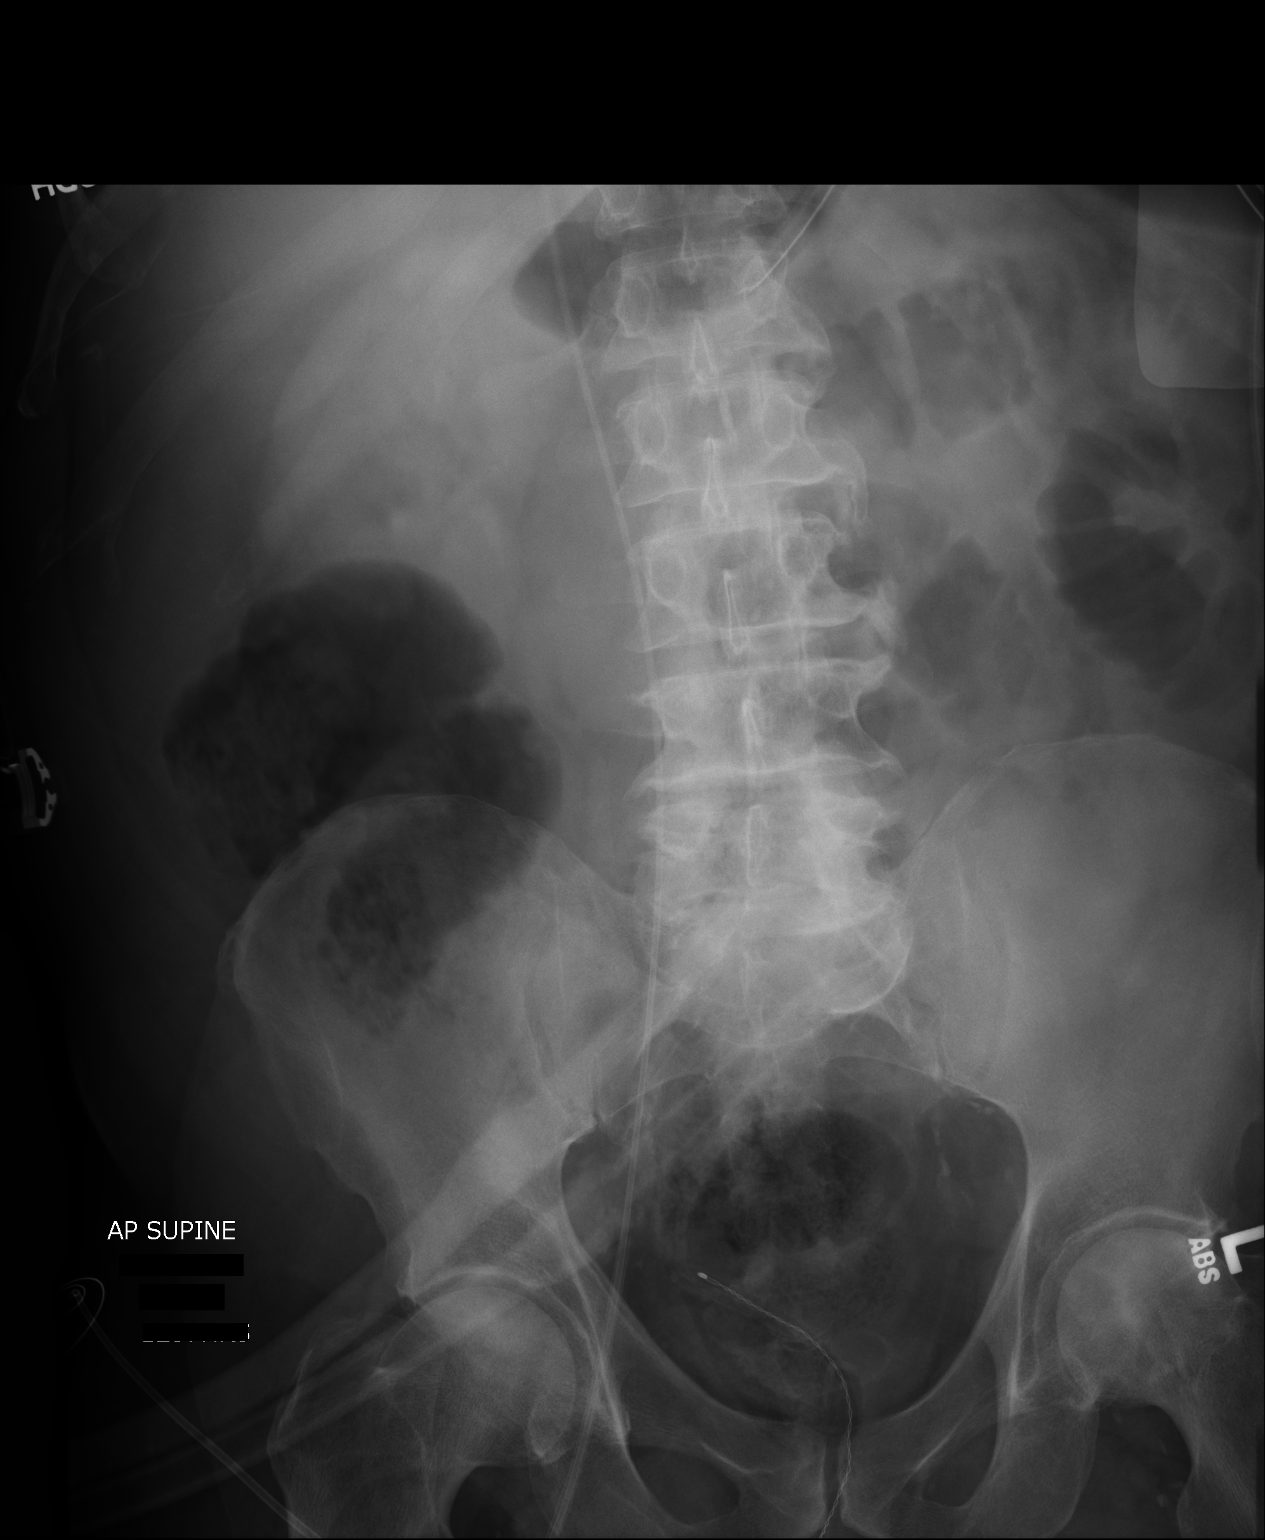

[1 of 1 positions shown; findings below may reference images not displayed]

FINDINGS: 2095 hr. Nasogastric tube tip overlies the proximal stomach. Mild
small bowel distension and wall thickening are noted. There is gas
within the cecum and rectum. Rectal probe in place. There are
degenerative changes of the lumbar spine. Deep sulcus sign is noted
at the left lung base, corresponding with a pneumothorax
demonstrated on concurrent chest radiographs.
IMPRESSION: Nasogastric tube in the proximal stomach. Small bowel distension and
wall thickening suggesting partial small bowel obstruction versus
ileus.
